# Patient Record
Sex: Female | Born: 1970 | Race: Black or African American | Hispanic: No | Marital: Single | Smoking: Never smoker
Health system: Southern US, Community
[De-identification: ages and names within clinical notes are randomized; demographics above are authoritative.]

## PROBLEM LIST (undated history)

## (undated) DIAGNOSIS — Z8619 Personal history of other infectious and parasitic diseases: Secondary | ICD-10-CM

## (undated) DIAGNOSIS — I209 Angina pectoris, unspecified: Secondary | ICD-10-CM

## (undated) DIAGNOSIS — Z8719 Personal history of other diseases of the digestive system: Secondary | ICD-10-CM

## (undated) DIAGNOSIS — E669 Obesity, unspecified: Secondary | ICD-10-CM

## (undated) DIAGNOSIS — IMO0002 Reserved for concepts with insufficient information to code with codable children: Secondary | ICD-10-CM

## (undated) DIAGNOSIS — I4891 Unspecified atrial fibrillation: Secondary | ICD-10-CM

## (undated) DIAGNOSIS — G43909 Migraine, unspecified, not intractable, without status migrainosus: Secondary | ICD-10-CM

## (undated) DIAGNOSIS — F419 Anxiety disorder, unspecified: Secondary | ICD-10-CM

## (undated) DIAGNOSIS — F32A Depression, unspecified: Secondary | ICD-10-CM

## (undated) DIAGNOSIS — R0600 Dyspnea, unspecified: Secondary | ICD-10-CM

## (undated) DIAGNOSIS — R0609 Other forms of dyspnea: Secondary | ICD-10-CM

## (undated) DIAGNOSIS — M1990 Unspecified osteoarthritis, unspecified site: Secondary | ICD-10-CM

## (undated) DIAGNOSIS — I10 Essential (primary) hypertension: Secondary | ICD-10-CM

## (undated) DIAGNOSIS — Z72821 Inadequate sleep hygiene: Secondary | ICD-10-CM

## (undated) DIAGNOSIS — M5030 Other cervical disc degeneration, unspecified cervical region: Secondary | ICD-10-CM

## (undated) DIAGNOSIS — Z8742 Personal history of other diseases of the female genital tract: Secondary | ICD-10-CM

## (undated) DIAGNOSIS — G4733 Obstructive sleep apnea (adult) (pediatric): Secondary | ICD-10-CM

## (undated) DIAGNOSIS — F319 Bipolar disorder, unspecified: Secondary | ICD-10-CM

## (undated) DIAGNOSIS — M5412 Radiculopathy, cervical region: Secondary | ICD-10-CM

## (undated) DIAGNOSIS — R87619 Unspecified abnormal cytological findings in specimens from cervix uteri: Secondary | ICD-10-CM

## (undated) DIAGNOSIS — D649 Anemia, unspecified: Secondary | ICD-10-CM

## (undated) DIAGNOSIS — N760 Acute vaginitis: Secondary | ICD-10-CM

## (undated) DIAGNOSIS — J45991 Cough variant asthma: Secondary | ICD-10-CM

## (undated) DIAGNOSIS — N938 Other specified abnormal uterine and vaginal bleeding: Secondary | ICD-10-CM

## (undated) DIAGNOSIS — B379 Candidiasis, unspecified: Secondary | ICD-10-CM

## (undated) DIAGNOSIS — N840 Polyp of corpus uteri: Secondary | ICD-10-CM

## (undated) DIAGNOSIS — K219 Gastro-esophageal reflux disease without esophagitis: Secondary | ICD-10-CM

## (undated) DIAGNOSIS — B9689 Other specified bacterial agents as the cause of diseases classified elsewhere: Secondary | ICD-10-CM

## (undated) DIAGNOSIS — D259 Leiomyoma of uterus, unspecified: Secondary | ICD-10-CM

## (undated) HISTORY — DX: Leiomyoma of uterus, unspecified: D25.9

## (undated) HISTORY — DX: Inadequate sleep hygiene: Z72.821

## (undated) HISTORY — DX: Cough variant asthma: J45.991

## (undated) HISTORY — DX: Personal history of other diseases of the digestive system: Z87.19

## (undated) HISTORY — DX: Other forms of dyspnea: R06.09

## (undated) HISTORY — DX: Morbid (severe) obesity due to excess calories: E66.01

## (undated) HISTORY — PX: KNEE SURGERY: SHX244

## (undated) HISTORY — DX: Candidiasis, unspecified: B37.9

## (undated) HISTORY — PX: BACK SURGERY: SHX140

## (undated) HISTORY — DX: Other specified abnormal uterine and vaginal bleeding: N93.8

## (undated) HISTORY — DX: Other specified bacterial agents as the cause of diseases classified elsewhere: B96.89

## (undated) HISTORY — DX: Acute vaginitis: N76.0

## (undated) HISTORY — DX: Polyp of corpus uteri: N84.0

## (undated) HISTORY — DX: Unspecified abnormal cytological findings in specimens from cervix uteri: R87.619

## (undated) HISTORY — DX: Reserved for concepts with insufficient information to code with codable children: IMO0002

## (undated) HISTORY — DX: Personal history of other infectious and parasitic diseases: Z86.19

## (undated) HISTORY — DX: Personal history of other diseases of the female genital tract: Z87.42

## (undated) HISTORY — DX: Gastro-esophageal reflux disease without esophagitis: K21.9

## (undated) HISTORY — DX: Obstructive sleep apnea (adult) (pediatric): G47.33

## (undated) HISTORY — DX: Radiculopathy, cervical region: M54.12

## (undated) HISTORY — PX: CARPAL TUNNEL RELEASE: SHX101

---

## 1993-07-01 DIAGNOSIS — Z8619 Personal history of other infectious and parasitic diseases: Secondary | ICD-10-CM

## 1993-07-01 HISTORY — DX: Personal history of other infectious and parasitic diseases: Z86.19

## 1997-07-01 HISTORY — PX: BREAST REDUCTION SURGERY: SHX8

## 1997-07-01 HISTORY — PX: TUBAL LIGATION: SHX77

## 2008-07-01 HISTORY — PX: TONSILLECTOMY: SUR1361

## 2009-01-11 ENCOUNTER — Ambulatory Visit: Payer: Self-pay | Admitting: Diagnostic Radiology

## 2009-01-11 ENCOUNTER — Emergency Department (HOSPITAL_BASED_OUTPATIENT_CLINIC_OR_DEPARTMENT_OTHER): Admission: EM | Admit: 2009-01-11 | Discharge: 2009-01-11 | Payer: Self-pay | Admitting: Emergency Medicine

## 2009-07-01 HISTORY — PX: HYSTEROSCOPY W/ ENDOMETRIAL ABLATION: SUR665

## 2009-07-01 HISTORY — DX: Morbid (severe) obesity due to excess calories: E66.01

## 2010-02-08 DIAGNOSIS — N938 Other specified abnormal uterine and vaginal bleeding: Secondary | ICD-10-CM

## 2010-02-08 HISTORY — DX: Other specified abnormal uterine and vaginal bleeding: N93.8

## 2010-03-01 DIAGNOSIS — Z8742 Personal history of other diseases of the female genital tract: Secondary | ICD-10-CM

## 2010-03-01 DIAGNOSIS — N840 Polyp of corpus uteri: Secondary | ICD-10-CM

## 2010-03-01 DIAGNOSIS — D259 Leiomyoma of uterus, unspecified: Secondary | ICD-10-CM

## 2010-03-01 HISTORY — DX: Leiomyoma of uterus, unspecified: D25.9

## 2010-03-01 HISTORY — DX: Personal history of other diseases of the female genital tract: Z87.42

## 2010-03-01 HISTORY — DX: Polyp of corpus uteri: N84.0

## 2010-04-24 ENCOUNTER — Ambulatory Visit (HOSPITAL_COMMUNITY): Admission: RE | Admit: 2010-04-24 | Discharge: 2010-04-24 | Payer: Self-pay | Admitting: Obstetrics and Gynecology

## 2010-08-15 ENCOUNTER — Inpatient Hospital Stay (HOSPITAL_COMMUNITY)
Admission: AD | Admit: 2010-08-15 | Discharge: 2010-08-15 | Disposition: A | Discharge status: Home / Self Care | Payer: Medicare Other | Source: Ambulatory Visit | Attending: Obstetrics and Gynecology | Admitting: Obstetrics and Gynecology

## 2010-08-15 DIAGNOSIS — IMO0002 Reserved for concepts with insufficient information to code with codable children: Secondary | ICD-10-CM | POA: Insufficient documentation

## 2010-08-15 LAB — DIFFERENTIAL
Basophils Relative: 1 % (ref 0–1)
Eosinophils Absolute: 0.5 10*3/uL (ref 0.0–0.7)
Eosinophils Relative: 8 % — ABNORMAL HIGH (ref 0–5)
Lymphs Abs: 2.2 10*3/uL (ref 0.7–4.0)
Neutrophils Relative %: 40 % — ABNORMAL LOW (ref 43–77)

## 2010-08-15 LAB — CBC
MCH: 28.5 pg (ref 26.0–34.0)
MCV: 89.9 fL (ref 78.0–100.0)
Platelets: 298 10*3/uL (ref 150–400)
RDW: 14.5 % (ref 11.5–15.5)
WBC: 5.6 10*3/uL (ref 4.0–10.5)

## 2010-08-15 LAB — WET PREP, GENITAL: Yeast Wet Prep HPF POC: NONE SEEN

## 2010-08-24 DIAGNOSIS — B9689 Other specified bacterial agents as the cause of diseases classified elsewhere: Secondary | ICD-10-CM

## 2010-08-24 HISTORY — DX: Other specified bacterial agents as the cause of diseases classified elsewhere: B96.89

## 2010-09-12 LAB — COMPREHENSIVE METABOLIC PANEL
BUN: 13 mg/dL (ref 6–23)
CO2: 24 mEq/L (ref 19–32)
Calcium: 9.1 mg/dL (ref 8.4–10.5)
Creatinine, Ser: 0.97 mg/dL (ref 0.4–1.2)
GFR calc non Af Amer: >60 mL/min (ref >60)
Glucose, Bld: 76 mg/dL (ref 70–99)

## 2010-09-12 LAB — CBC
HCT: 33.4 % — ABNORMAL LOW (ref 36.0–46.0)
Hemoglobin: 11.1 g/dL — ABNORMAL LOW (ref 12.0–15.0)
MCH: 28.7 pg (ref 26.0–34.0)
MCHC: 33.1 g/dL (ref 30.0–36.0)
RDW: 15.9 % — ABNORMAL HIGH (ref 11.5–15.5)

## 2010-09-12 LAB — PREGNANCY, URINE: Preg Test, Ur: NEGATIVE

## 2010-10-08 LAB — DIFFERENTIAL
Basophils Absolute: 0 10*3/uL (ref 0.0–0.1)
Basophils Relative: 1 % (ref 0–1)
Eosinophils Absolute: 0.4 10*3/uL (ref 0.0–0.7)
Monocytes Absolute: 0.9 10*3/uL (ref 0.1–1.0)
Monocytes Relative: 12 % (ref 3–12)
Neutro Abs: 3.7 10*3/uL (ref 1.7–7.7)
Neutrophils Relative %: 50 % (ref 43–77)

## 2010-10-08 LAB — CBC
MCHC: 33.1 g/dL (ref 30.0–36.0)
MCV: 87.8 fL (ref 78.0–100.0)
RDW: 14.1 % (ref 11.5–15.5)

## 2010-10-08 LAB — URINALYSIS, ROUTINE W REFLEX MICROSCOPIC
Glucose, UA: NEGATIVE mg/dL
Hgb urine dipstick: NEGATIVE
Ketones, ur: NEGATIVE mg/dL
Protein, ur: NEGATIVE mg/dL
Urobilinogen, UA: 0.2 mg/dL (ref 0.0–1.0)

## 2010-10-08 LAB — POCT CARDIAC MARKERS: Troponin i, poc: <0.05 ng/mL (ref 0.00–0.09)

## 2010-10-08 LAB — BASIC METABOLIC PANEL
BUN: 16 mg/dL (ref 6–23)
Calcium: 8.7 mg/dL (ref 8.4–10.5)
GFR calc Af Amer: >60 mL/min (ref >60)
Potassium: 4 mEq/L (ref 3.5–5.1)
Sodium: 142 mEq/L (ref 135–145)

## 2010-10-17 DIAGNOSIS — Z8719 Personal history of other diseases of the digestive system: Secondary | ICD-10-CM

## 2010-10-17 HISTORY — DX: Personal history of other diseases of the digestive system: Z87.19

## 2011-07-08 ENCOUNTER — Emergency Department (INDEPENDENT_AMBULATORY_CARE_PROVIDER_SITE_OTHER)
Admission: EM | Admit: 2011-07-08 | Discharge: 2011-07-08 | Disposition: A | Discharge status: Home / Self Care | Payer: Medicare Other | Source: Home / Self Care | Attending: Emergency Medicine | Admitting: Emergency Medicine

## 2011-07-08 DIAGNOSIS — J111 Influenza due to unidentified influenza virus with other respiratory manifestations: Secondary | ICD-10-CM

## 2011-07-08 DIAGNOSIS — J040 Acute laryngitis: Secondary | ICD-10-CM

## 2011-07-08 DIAGNOSIS — J40 Bronchitis, not specified as acute or chronic: Secondary | ICD-10-CM

## 2011-07-08 DIAGNOSIS — J329 Chronic sinusitis, unspecified: Secondary | ICD-10-CM

## 2011-07-08 DIAGNOSIS — R6889 Other general symptoms and signs: Secondary | ICD-10-CM

## 2011-07-08 DIAGNOSIS — H109 Unspecified conjunctivitis: Secondary | ICD-10-CM

## 2011-07-08 HISTORY — DX: Migraine, unspecified, not intractable, without status migrainosus: G43.909

## 2011-07-08 HISTORY — DX: Essential (primary) hypertension: I10

## 2011-07-08 MED ORDER — POLYMYXIN B-TRIMETHOPRIM 10000-0.1 UNIT/ML-% OP SOLN: 1.0000 [drp] | OPHTHALMIC | Status: AC

## 2011-07-08 MED ORDER — GUAIFENESIN-CODEINE 100-10 MG/5ML PO SYRP: 10.0000 mL | ORAL_SOLUTION | Freq: Four times a day (QID) | ORAL | Status: AC | PRN

## 2011-07-08 MED ORDER — PREDNISONE 10 MG PO TABS: ORAL_TABLET | ORAL | Status: DC

## 2011-07-08 MED ORDER — AZITHROMYCIN 250 MG PO TABS: ORAL_TABLET | ORAL | Status: AC

## 2011-07-08 NOTE — ED Provider Notes (Signed)
Chief Complaint  Patient presents with  . URI    History of Present Illness:  Mrs. Barbara Richardson has had a one-week history of headache, fever, sweats, cough productive of green sputum, wheezing, nasal congestion with greenish drainage with blood, sinus pressure, headache, sore throat, hoarseness, and her eyes have been red and she's had greenish drainage from both eyes. She denies any blurry vision. She does have a history of high blood pressure and asthma. She usually controls her asthma with albuterol both by MDI and also by nebulizer. She had taken some of the MDI and nebulizer today.  Review of Systems:  Other than noted above, the patient denies any of the following symptoms. Systemic:  No fever, chills, sweats, fatigue, myalgias, headache, or anorexia. Eye:  No redness, pain or drainage. ENT:  No earache, nasal congestion, rhinorrhea, sinus pressure, or sore throat. Lungs:  No cough, sputum production, wheezing, shortness of breath. Or chest pain. GI:  No nausea, vomiting, abdominal pain or diarrhea. Skin:  No rash or itching.  PMFSH:  Past medical history, family history, social history, meds, and allergies were reviewed.  Physical Exam:   Vital signs:  BP 166/76  Pulse 67  Temp(Src) 99.1 F (37.3 C) (Oral)  Resp 18  SpO2 99%  LMP 07/01/2011 General:  Alert, in no distress. Eye:  Both conjunctiva was were injected and there was some yellowish green drainage.  ENT:  TMs and canals were normal, without erythema or inflammation.  Nasal mucosa was clear and uncongested, without drainage.  Mucous membranes were moist.  Pharynx was clear, without exudate or drainage.  There were no oral ulcerations or lesions. Neck:  Supple, no adenopathy, tenderness or mass. Lungs:  No respiratory distress.  Lungs were clear to auscultation, without wheezes, rales or rhonchi.  Breath sounds were clear and equal bilaterally. Heart:  Regular rhythm, without gallops, murmers or rubs. Skin:  Clear, warm, and  dry, without rash or lesions.  Labs:  None    Radiology:  No results found.  Medications given in [facility]:  None  Assessment:   Diagnoses that have been ruled out:  Diagnoses that are still under consideration:  Final diagnoses:  Influenza-like illness  Conjunctivitis  Sinusitis  Bronchitis  Laryngitis     Plan:   1.  The following meds were prescribed:   New Prescriptions   AZITHROMYCIN (ZITHROMAX Z-PAK) 250 MG TABLET    Take as directed.   GUAIFENESIN-CODEINE (GUIATUSS AC) 100-10 MG/5ML SYRUP    Take 10 mLs by mouth 4 (four) times daily as needed for cough.   PREDNISONE (DELTASONE) 10 MG TABLET    Take 4 tabs daily for 4 days, 3 tabs daily for 4 days, 2 tabs daily for 4 days, then 1 tab daily for 4 days.  Take all tabs at one time with food and preferably in the morning except for the first dose.   TRIMETHOPRIM-POLYMYXIN B (POLYTRIM) OPHTHALMIC SOLUTION    Place 1 drop into both eyes every 4 (four) hours.   2.  The patient was instructed in symptomatic care and handouts were given. 3.  The patient was told to return if becoming worse in any way, if no better in 3 or 4 days, and given some red flag symptoms that would indicate earlier return.   Roque Lias, MD 07/08/11 1754

## 2011-07-08 NOTE — ED Notes (Signed)
C/o productive cough of green sputum, green nasal drainage since 07/02/11.  States yesterday she developed green drainage, redness and soreness to both eyes and today she is hoarse.  States she had sorethroat initially but that resolved.

## 2011-08-19 ENCOUNTER — Encounter (HOSPITAL_COMMUNITY): Payer: Self-pay

## 2011-08-19 ENCOUNTER — Emergency Department (INDEPENDENT_AMBULATORY_CARE_PROVIDER_SITE_OTHER)
Admission: EM | Admit: 2011-08-19 | Discharge: 2011-08-19 | Disposition: A | Discharge status: Home / Self Care | Payer: Medicare Other | Source: Home / Self Care | Attending: Emergency Medicine | Admitting: Emergency Medicine

## 2011-08-19 DIAGNOSIS — L03211 Cellulitis of face: Secondary | ICD-10-CM

## 2011-08-19 DIAGNOSIS — L0201 Cutaneous abscess of face: Secondary | ICD-10-CM

## 2011-08-19 MED ORDER — DOXYCYCLINE HYCLATE 100 MG PO CAPS: 100.0000 mg | ORAL_CAPSULE | Freq: Two times a day (BID) | ORAL | Status: DC

## 2011-08-19 MED ORDER — ACETAMINOPHEN 325 MG PO TABS
ORAL_TABLET | ORAL | Status: AC
  Filled 2011-08-19: qty 3

## 2011-08-19 MED ORDER — IBUPROFEN 800 MG PO TABS
800.0000 mg | ORAL_TABLET | Freq: Once | ORAL | Status: AC
  Administered 2011-08-19: 800 mg via ORAL

## 2011-08-19 MED ORDER — IBUPROFEN 800 MG PO TABS
ORAL_TABLET | ORAL | Status: AC
  Filled 2011-08-19: qty 1

## 2011-08-19 MED ORDER — ACETAMINOPHEN 500 MG PO TABS
1000.0000 mg | ORAL_TABLET | Freq: Once | ORAL | Status: AC
  Administered 2011-08-19: 1000 mg via ORAL

## 2011-08-19 MED ORDER — DOXYCYCLINE HYCLATE 100 MG PO CAPS: 100.0000 mg | ORAL_CAPSULE | Freq: Two times a day (BID) | ORAL | Status: AC

## 2011-08-19 NOTE — Discharge Instructions (Signed)
Abscess An abscess (boil or furuncle) is an infected area under your skin. This area is filled with yellowish white fluid (pus). HOME CARE   Only take medicine as told by your doctor.   Keep the skin clean around your abscess. Keep clothes that may touch the abscess clean.   Change any bandages (dressings) as told by your doctor.   Avoid direct skin contact with other people. The infection can spread by skin contact with others.   Practice good hygiene and do not share personal care items.   Do not share athletic equipment, towels, or whirlpools. Shower after every practice or work out session.   If a draining area cannot be covered:   Do not play sports.   Children should not go to daycare until the wound has healed or until fluid (drainage) stops coming out of the wound.   See your doctor for a follow-up visit as told.  GET HELP RIGHT AWAY IF:   There is more pain, puffiness (swelling), and redness in the wound site.   There is fluid or bleeding from the wound site.   You have muscle aches, chills, fever, or feel sick.   You or your child has a temperature by mouth above 102 F (38.9 C), not controlled by medicine.   Your baby is older than 3 months with a rectal temperature of 102 F (38.9 C) or higher.  MAKE SURE YOU:   Understand these instructions.   Will watch your condition.   Will get help right away if you are not doing well or get worse.  Document Released: 12/04/2007 Document Revised: 02/27/2011 Document Reviewed: 12/04/2007 [facility] Patient Information 2012 [place], [place].

## 2011-08-19 NOTE — ED Notes (Signed)
C/o raised swollen painful area on face since 2-13; minimal relief w OTC medications, cold pack

## 2011-08-19 NOTE — ED Notes (Signed)
Discussed medication compliance, wound care

## 2011-08-19 NOTE — ED Provider Notes (Signed)
History     CSN: [id number]  Arrival date & time 08/19/11  1009   First MD Initiated Contact with Patient 08/19/11 1048      Chief Complaint  Patient presents with  . Skin Ulcer    (Consider location/radiation/quality/duration/timing/severity/associated sxs/prior treatment) HPI Comments: Patient presents to urgent care complaining of a swollen and raised area very tender on the left side of her face (points to left upper lip area obvious abscess) It has been about 4 days and is getting larger and is not draining looks like it has a head"  No fevers, no facial injuries,.  Patient is a 41 y.o. female presenting with abscess. The history is provided by the patient.  Abscess  This is a new problem. The problem occurs continuously. The abscess is present on the face. The problem is moderate. The abscess is characterized by redness and swelling. Pertinent negatives include no anorexia, not drinking less, no fever, no fussiness, no diarrhea, no sore throat and no cough.    Past Medical History  Diagnosis Date  . Hypertension   . Migraines   . Asthma     Past Surgical History  Procedure Date  . Tonsillectomy   . Cesarean section   . Knee surgery     History reviewed. No pertinent family history.  History  Substance Use Topics  . Smoking status: Never Smoker   . Smokeless tobacco: Not on file  . Alcohol Use: No    OB History    Grav Para Term Preterm Abortions TAB SAB Ect Mult Living                  Review of Systems  Constitutional: Negative for fever and fatigue.  HENT: Negative for ear pain and sore throat.   Eyes: Negative for redness.  Respiratory: Negative for cough.   Gastrointestinal: Negative for diarrhea and anorexia.  Skin: Negative for color change and rash.    Allergies  Review of patient's allergies indicates no known allergies.  Home Medications   Current Outpatient Rx  Name Route Sig Dispense Refill  . WELLBUTRIN PO Oral Take by mouth.        . FUROSEMIDE 40 MG PO TABS Oral Take 40 mg by mouth daily.    Marland Kitchen LISINOPRIL 10 MG PO TABS Oral Take 10 mg by mouth daily.    Marland Kitchen LOSARTAN POTASSIUM 100 MG PO TABS Oral Take 100 mg by mouth daily.    Marland Kitchen METOPROLOL SUCCINATE ER 50 MG PO TB24 Oral Take 50 mg by mouth daily. Take with or immediately following a meal.    . OMEPRAZOLE-SODIUM BICARBONATE 20-1100 MG PO CAPS Oral Take 1 capsule by mouth daily before breakfast.    . PRESCRIPTION MEDICATION  States she takes 3 BP medications     . TOPAMAX PO Oral Take by mouth.      . ALBUTEROL IN Inhalation Inhale into the lungs.      Marland Kitchen DOXYCYCLINE HYCLATE 100 MG PO CAPS Oral Take 1 capsule (100 mg total) by mouth 2 (two) times daily. 20 capsule 0  . PREDNISONE 10 MG PO TABS  Take 4 tabs daily for 4 days, 3 tabs daily for 4 days, 2 tabs daily for 4 days, then 1 tab daily for 4 days.  Take all tabs at one time with food and preferably in the morning except for the first dose. 40 tablet 0  . IMITREX PO Oral Take by mouth.        BP  144/80  Pulse 60  Temp(Src) 98.2 F (36.8 C) (Oral)  Resp 18  SpO2 100%  Physical Exam  Nursing note and vitals reviewed. Constitutional: No distress.  HENT:  Head: Normocephalic.    Eyes: Conjunctivae are normal. No scleral icterus.  Neck: Neck supple.  Pulmonary/Chest: Effort normal.  Abdominal: Soft.  Lymphadenopathy:    She has no cervical adenopathy.  Skin: No rash noted. There is erythema.    ED Course  INCISION AND DRAINAGE Date/Time: 08/19/2011 12:29 PM Performed by: Staci Dack Authorized by: Jimmie Molly Consent: Verbal consent obtained. Risks and benefits: risks, benefits and alternatives were discussed Consent given by: patient Patient understanding: patient does not state understanding of the procedure being performed Patient identity confirmed: verbally with patient Type: abscess Location: facial. Local anesthetic: lidocaine 1% without epinephrine Anesthetic total: 3 ml Patient sedated:  no Scalpel size: 15 Incision type: single straight Complexity: complex Drainage: purulent Packing material: 1/2 in iodoform gauze Patient tolerance: Patient tolerated the procedure well with no immediate complications. Comments: And during procedure patient did not feel incision of abscess on direct questioning. Patient post procedure film soreness and require pain medicine both Motrin and Tylenol were provided patient driving on her own.   (including critical care time)   Labs Reviewed  CULTURE, ROUTINE-ABSCESS   No results found.   1. Abscess of face       MDM  Left-sided facial abscess. Peri-oral I+D performed. Abundant purulent material obtained and cultured. For packing removal.        Jimmie Molly, MD 08/19/11 1231

## 2011-08-21 ENCOUNTER — Emergency Department (INDEPENDENT_AMBULATORY_CARE_PROVIDER_SITE_OTHER)
Admission: EM | Admit: 2011-08-21 | Discharge: 2011-08-21 | Disposition: A | Discharge status: Home Health | Payer: Medicare Other | Source: Home / Self Care | Attending: Family Medicine | Admitting: Family Medicine

## 2011-08-21 ENCOUNTER — Encounter (HOSPITAL_COMMUNITY): Payer: Self-pay | Admitting: Emergency Medicine

## 2011-08-21 DIAGNOSIS — L0291 Cutaneous abscess, unspecified: Secondary | ICD-10-CM

## 2011-08-21 LAB — CULTURE, ROUTINE-ABSCESS: Gram Stain: NONE SEEN

## 2011-08-21 NOTE — ED Notes (Signed)
PT HERE WITH PACKING REMOVAL AND WOUND RECHECK S/P I/D WITH PACKING TO LEFT SIDE OF MOUTH BOIL X 2 DYS AGO.PTT DENIES PAIN.NO FEVERS.PACKING INTACT WITH MIN PUS DRAINAGE.PT TAKING PRESCRIBED ATB'S

## 2011-08-21 NOTE — ED Notes (Addendum)
Abscess L face: Mod. MRSA.  Pt. adequately treated with Doxycycline. Pt. Verified x 2 and given result and told she was adequately treated with Doxycycline. Pt. given MRSA instructions and voiced understanding. Barbara Richardson 08/21/2011

## 2011-08-21 NOTE — Discharge Instructions (Signed)
Change bandage daily. Monitor drainage and return if drainage stops or signs of infection appear such as redness, warmth, increased pain.

## 2011-08-21 NOTE — ED Provider Notes (Signed)
History     CSN: [id number]  Arrival date & time 08/21/11  1022   First MD Initiated Contact with Patient 08/21/11 1235      Chief Complaint  Patient presents with  . Wound Check    (Consider location/radiation/quality/duration/timing/severity/associated sxs/prior treatment) HPI Comments: Barbara Richardson presents for evaluation for wound recheck. She was seen here 2 days ago and had an abscess on her face, incised and drained. The wound was packed, and she was told to return for reevaluation. She reports improvement in her pain and swelling of her face. She denies any other complications or complaints. She is taking antibiotics.  Patient is a 41 y.o. female presenting with wound check. The history is provided by the patient.  Wound Check  She was treated in the ED 2 to 3 days ago. Previous treatment in the ED includes I&D of abscess. Treatments since wound repair include oral antibiotics and a wound recheck. There has been colored discharge from the wound. There is no redness present. There is no swelling present. The pain has improved.    Past Medical History  Diagnosis Date  . Hypertension   . Migraines   . Asthma     Past Surgical History  Procedure Date  . Tonsillectomy   . Cesarean section   . Knee surgery     No family history on file.  History  Substance Use Topics  . Smoking status: Never Smoker   . Smokeless tobacco: Not on file  . Alcohol Use: No    OB History    Grav Para Term Preterm Abortions TAB SAB Ect Mult Living                  Review of Systems  Constitutional: Negative.   HENT: Negative.   Eyes: Negative.   Respiratory: Negative.   Cardiovascular: Negative.   Gastrointestinal: Negative.   Genitourinary: Negative.   Musculoskeletal: Negative.   Skin: Positive for wound.  Neurological: Negative.     Allergies  Review of patient's allergies indicates no known allergies.  Home Medications   Current Outpatient Rx  Name Route Sig  Dispense Refill  . ALBUTEROL IN Inhalation Inhale into the lungs.      . WELLBUTRIN PO Oral Take by mouth.      . DOXYCYCLINE HYCLATE 100 MG PO CAPS Oral Take 1 capsule (100 mg total) by mouth 2 (two) times daily. 20 capsule 0  . FUROSEMIDE 40 MG PO TABS Oral Take 40 mg by mouth daily.    Marland Kitchen LISINOPRIL 10 MG PO TABS Oral Take 10 mg by mouth daily.    Marland Kitchen LOSARTAN POTASSIUM 100 MG PO TABS Oral Take 100 mg by mouth daily.    Marland Kitchen METOPROLOL SUCCINATE ER 50 MG PO TB24 Oral Take 50 mg by mouth daily. Take with or immediately following a meal.    . OMEPRAZOLE-SODIUM BICARBONATE 20-1100 MG PO CAPS Oral Take 1 capsule by mouth daily before breakfast.    . PREDNISONE 10 MG PO TABS  Take 4 tabs daily for 4 days, 3 tabs daily for 4 days, 2 tabs daily for 4 days, then 1 tab daily for 4 days.  Take all tabs at one time with food and preferably in the morning except for the first dose. 40 tablet 0  . PRESCRIPTION MEDICATION  States she takes 3 BP medications     . IMITREX PO Oral Take by mouth.      . TOPAMAX PO Oral Take by mouth.  BP 153/80  Pulse 64  Temp(Src) 98.4 F (36.9 C) (Oral)  Resp 16  SpO2 100%  Physical Exam  Nursing note and vitals reviewed. Constitutional: She is oriented to person, place, and time. She appears well-developed and well-nourished.  HENT:  Head: Normocephalic.         1 cm wound on LEFT lower chin, with packing in place; crusting around the packing  Eyes: EOM are normal.  Neck: Normal range of motion.  Pulmonary/Chest: Effort normal.  Musculoskeletal: Normal range of motion.  Neurological: She is alert and oriented to person, place, and time.  Skin: Skin is warm and dry. Lesion noted.       1 cm wound on LEFT lower chin, with packing in place; crusting around the packing  Psychiatric: Her behavior is normal.    ED Course  Procedures (including critical care time)  Labs Reviewed - No data to display No results found.   1. Abscess       MDM  Packing  removed; dressing applied; continue antibiotics        Richardo Priest, MD 08/21/11 1527

## 2011-10-18 ENCOUNTER — Ambulatory Visit (INDEPENDENT_AMBULATORY_CARE_PROVIDER_SITE_OTHER): Payer: Medicaid Other | Admitting: Obstetrics and Gynecology

## 2011-10-18 ENCOUNTER — Encounter: Payer: Self-pay | Admitting: Obstetrics and Gynecology

## 2011-10-18 VITALS — BP 122/82 | Ht 69.0 in | Wt 358.0 lb

## 2011-10-18 DIAGNOSIS — Z01419 Encounter for gynecological examination (general) (routine) without abnormal findings: Secondary | ICD-10-CM

## 2011-10-18 DIAGNOSIS — I10 Essential (primary) hypertension: Secondary | ICD-10-CM

## 2011-10-18 DIAGNOSIS — Z0000 Encounter for general adult medical examination without abnormal findings: Secondary | ICD-10-CM

## 2011-10-18 DIAGNOSIS — J45991 Cough variant asthma: Secondary | ICD-10-CM | POA: Insufficient documentation

## 2011-10-18 DIAGNOSIS — K219 Gastro-esophageal reflux disease without esophagitis: Secondary | ICD-10-CM

## 2011-10-18 DIAGNOSIS — J45909 Unspecified asthma, uncomplicated: Secondary | ICD-10-CM

## 2011-10-18 HISTORY — DX: Cough variant asthma: J45.991

## 2011-10-18 NOTE — Progress Notes (Signed)
The patient reports:normal menses, no abnormal bleeding, pelvic pain or discharge  Contraception:bilateral tubal ligation  Last mammogram:was normal  March 2012 Last pap: was normal April  2012  GC/Chlamydia cultures offered: declined HIV/RPR/HbsAg offered:  declined HSV 1 and 2 glycoprotein offered: declined  Menstrual cycle regular and monthly: Yes Menstrual flow normal: Yes  Urinary symptoms: none Normal bowel movements: Yes Reports abuse at home: No:

## 2011-10-18 NOTE — Progress Notes (Signed)
Subjective:    Barbara Richardson is a 41 y.o. female, G2P2, who presents for an annual exam. S/P endometrial ablation    History   Social History  . Marital Status: Single    Spouse Name: N/A    Number of Children: N/A  . Years of Education: N/A   Social History Main Topics  . Smoking status: Never Smoker   . Smokeless tobacco: Never Used  . Alcohol Use: No  . Drug Use: No  . Sexually Active: No   Other Topics Concern  . None   Social History Narrative  . None    Menstrual cycle:   LMP: Patient's last menstrual period was 09/24/2011.           Cycle: Regular, monthly with normal flow and no severe dysmenorrha  The following portions of the patient's history were reviewed and updated as appropriate: allergies, current medications, past family history, past medical history, past social history, past surgical history and problem list.  Review of Systems Pertinent items are noted in HPI. Breast:Negative for breast lump,nipple discharge or nipple retraction Gastrointestinal: Negative for abdominal pain, change in bowel habits or rectal bleeding Urinary:negative   Objective:    BP 122/82  Ht 5\' 9"  (1.753 m)  Wt 358 lb (162.388 kg)  BMI 52.87 kg/m2  LMP 09/24/2011    Weight:  Wt Readings from Last 1 Encounters:  10/18/11 358 lb (162.388 kg)          BMI: Body mass index is 52.87 kg/(m^2).  General Appearance: Alert, appropriate appearance for age. No acute distress HEENT: Grossly normal Neck / Thyroid: Supple, no masses, nodes or enlargement Lungs: clear to auscultation bilaterally Back: No CVA tenderness Breast Exam: No masses or nodes.No dimpling, nipple retraction or discharge. Cardiovascular: Regular rate and rhythm. S1, S2, no murmur Gastrointestinal: Soft, non-tender, no masses or organomegaly Pelvic Exam: Vulva and vagina appear normal. Bimanual exam reveals normal uterus and adnexa. Rectovaginal: normal rectal, no masses Lymphatic Exam: Non-palpable nodes  in neck, clavicular, axillary, or inguinal regions Skin: no rash or abnormalities Neurologic: Normal gait and speech, no tremor  Psychiatric: Alert and oriented, appropriate affect.   Wet Prep:not applicable Urinalysis:not applicable UPT: Not done   Assessment:    Normal gyn exam  S/P endometrial ablation   Plan:    mammogram pap smear return annually or prn STD screening: declined Contraception:bilateral tubal ligation      Barbara Richardson AMD

## 2011-10-21 LAB — PAP IG W/ RFLX HPV ASCU

## 2012-05-12 ENCOUNTER — Ambulatory Visit: Payer: Medicare Other | Attending: Clinical | Admitting: Audiology

## 2012-05-12 DIAGNOSIS — F802 Mixed receptive-expressive language disorder: Secondary | ICD-10-CM | POA: Insufficient documentation

## 2013-02-10 ENCOUNTER — Ambulatory Visit (INDEPENDENT_AMBULATORY_CARE_PROVIDER_SITE_OTHER): Payer: Worker's Compensation | Admitting: General Surgery

## 2013-02-10 ENCOUNTER — Encounter (INDEPENDENT_AMBULATORY_CARE_PROVIDER_SITE_OTHER): Payer: Self-pay | Admitting: General Surgery

## 2013-02-10 VITALS — BP 140/98 | HR 76 | Resp 16 | Ht 69.0 in | Wt 344.8 lb

## 2013-02-10 DIAGNOSIS — IMO0002 Reserved for concepts with insufficient information to code with codable children: Secondary | ICD-10-CM

## 2013-02-10 NOTE — Progress Notes (Signed)
Subjective:     Patient ID: Marzetta Board, female   DOB: 1971/04/11, 42 y.o.   MRN: [id number]  HPI The patient is a 42 year old female who is referred by Dr. Jola Schmidt for evaluation of the left gluteal spider bite. Patient is approximately 2 weeks ago. She states she was given a course of antibiotics she was complete at this time. The previous drainage has stopped at this time and has never had any fevers while at home.  Review of Systems No fevers chills or emesis while at home.    Objective:   Physical Exam Small superior left gluteal area of induration. There is no synovitis. There is no erythema. There is no drainage.    Assessment:     A 42 year old female status post spider bite with resolved cellulitis     Plan:     1. No need for incision and drainage at this time. 2. Patient follow up as needed

## 2013-04-05 ENCOUNTER — Emergency Department (INDEPENDENT_AMBULATORY_CARE_PROVIDER_SITE_OTHER)
Admission: EM | Admit: 2013-04-05 | Discharge: 2013-04-05 | Disposition: A | Discharge status: Home / Self Care | Payer: Medicare Other | Source: Home / Self Care | Attending: Family Medicine | Admitting: Family Medicine

## 2013-04-05 ENCOUNTER — Encounter (HOSPITAL_COMMUNITY): Payer: Self-pay | Admitting: Emergency Medicine

## 2013-04-05 DIAGNOSIS — J45909 Unspecified asthma, uncomplicated: Secondary | ICD-10-CM

## 2013-04-05 DIAGNOSIS — J40 Bronchitis, not specified as acute or chronic: Secondary | ICD-10-CM

## 2013-04-05 MED ORDER — ALBUTEROL SULFATE (5 MG/ML) 0.5% IN NEBU
INHALATION_SOLUTION | RESPIRATORY_TRACT | Status: AC
  Filled 2013-04-05: qty 1

## 2013-04-05 MED ORDER — IPRATROPIUM BROMIDE 0.02 % IN SOLN
RESPIRATORY_TRACT | Status: AC
  Filled 2013-04-05: qty 2.5

## 2013-04-05 MED ORDER — ACETAMINOPHEN 325 MG PO TABS
650.0000 mg | ORAL_TABLET | Freq: Once | ORAL | Status: AC
  Administered 2013-04-05: 650 mg via ORAL

## 2013-04-05 MED ORDER — IPRATROPIUM BROMIDE 0.02 % IN SOLN
0.5000 mg | Freq: Once | RESPIRATORY_TRACT | Status: AC
  Administered 2013-04-05: 0.5 mg via RESPIRATORY_TRACT

## 2013-04-05 MED ORDER — PREDNISONE 10 MG PO TABS: ORAL_TABLET | ORAL | Status: DC

## 2013-04-05 MED ORDER — PREDNISONE 50 MG PO TABS: 50.0000 mg | ORAL_TABLET | Freq: Every day | ORAL | Status: DC

## 2013-04-05 MED ORDER — GUAIFENESIN-CODEINE 100-10 MG/5ML PO SOLN: 5.0000 mL | Freq: Three times a day (TID) | ORAL | Status: DC | PRN

## 2013-04-05 MED ORDER — AZITHROMYCIN 250 MG PO TABS: 250.0000 mg | ORAL_TABLET | Freq: Every day | ORAL | Status: DC

## 2013-04-05 MED ORDER — ONDANSETRON 4 MG PO TBDP
4.0000 mg | ORAL_TABLET | Freq: Once | ORAL | Status: AC
  Administered 2013-04-05: 4 mg via ORAL

## 2013-04-05 MED ORDER — ACETAMINOPHEN 325 MG PO TABS
ORAL_TABLET | ORAL | Status: AC
  Filled 2013-04-05: qty 2

## 2013-04-05 MED ORDER — ALBUTEROL SULFATE (5 MG/ML) 0.5% IN NEBU
5.0000 mg | INHALATION_SOLUTION | Freq: Once | RESPIRATORY_TRACT | Status: AC
  Administered 2013-04-05: 5 mg via RESPIRATORY_TRACT

## 2013-04-05 MED ORDER — ALBUTEROL SULFATE HFA 108 (90 BASE) MCG/ACT IN AERS: 2.0000 | INHALATION_SPRAY | Freq: Four times a day (QID) | RESPIRATORY_TRACT | Status: AC | PRN

## 2013-04-05 MED ORDER — ONDANSETRON 4 MG PO TBDP
ORAL_TABLET | ORAL | Status: AC
  Filled 2013-04-05: qty 1

## 2013-04-05 NOTE — ED Notes (Signed)
C/o productive cough with green sputum. Chest congestion. Body aches. States felt feverish.  Onset Saturday.   Pt has not tried any otc meds for symptoms.

## 2013-04-05 NOTE — ED Provider Notes (Signed)
Barbara Richardson is a 42 y.o. female who presents to Urgent Care today for cough congestion vomiting fever or and muscle aches. Daughter had similar viral illness earlier this week. No chest pains or trouble breathing. Patient medical history significant for asthma. She's been using albuterol nebulizer which has helped some. The cough is quite bothersome. No palpitations or syncope. She has not tried any over-the-counter medication.   Past Medical History  Diagnosis Date  . Hypertension   . Asthma   . Migraines   . GERD (gastroesophageal reflux disease)   . Yeast infection   . Hx of chlamydia infection 1995  . Abnormal Pap smear   . Hx of constipation 10/17/2010  . BV (bacterial vaginosis) 08/24/2010  . Endometrial polyp 03/2010  . Uterine fibroid 03/2010  . Hx of menorrhagia 03/2010  . DUB (dysfunctional uterine bleeding) 02/08/2010  . Obesity, morbid (more than 100 lbs over ideal weight or BMI > 40) 2011   History  Substance Use Topics  . Smoking status: Never Smoker   . Smokeless tobacco: Former Neurosurgeon  . Alcohol Use: No   ROS as above Medications reviewed. No current facility-administered medications for this encounter.   Current Outpatient Prescriptions  Medication Sig Dispense Refill  . BuPROPion HCl (WELLBUTRIN PO) Take by mouth.        . furosemide (LASIX) 40 MG tablet Take 40 mg by mouth daily.      Marland Kitchen lisinopril (PRINIVIL,ZESTRIL) 10 MG tablet Take 10 mg by mouth daily.      Marland Kitchen losartan (COZAAR) 100 MG tablet Take 100 mg by mouth daily.      . metoprolol succinate (TOPROL-XL) 50 MG 24 hr tablet Take 50 mg by mouth daily. Take with or immediately following a meal.      . Multiple Vitamin (MULTIVITAMIN) capsule Take 1 capsule by mouth daily.      Maxwell Caul Bicarbonate (ZEGERID) 20-1100 MG CAPS Take 1 capsule by mouth daily before breakfast.      . Topiramate (TOPAMAX PO) Take by mouth.        Marland Kitchen albuterol (PROVENTIL HFA;VENTOLIN HFA) 108 (90 BASE) MCG/ACT inhaler  Inhale 2 puffs into the lungs every 6 (six) hours as needed for wheezing.  1 Inhaler  2  . ALBUTEROL IN Inhale into the lungs.        Marland Kitchen azithromycin (ZITHROMAX) 250 MG tablet Take 1 tablet (250 mg total) by mouth daily. Take first 2 tablets together, then 1 every day until finished.  6 tablet  0  . guaiFENesin-codeine 100-10 MG/5ML syrup Take 5 mLs by mouth 3 (three) times daily as needed for cough.  120 mL  0  . Hydrocodone-Acetaminophen 10-500 MG/15ML SOLN Take 500 mg by mouth once.      . predniSONE (DELTASONE) 10 MG tablet Take 4 tabs daily for 4 days, 3 tabs daily for 4 days, 2 tabs daily for 4 days, then 1 tab daily for 4 days.  Take all tabs at one time with food and preferably in the morning except for the first dose.  40 tablet  0  . predniSONE (DELTASONE) 50 MG tablet Take 1 tablet (50 mg total) by mouth daily.  5 tablet  0  . PRESCRIPTION MEDICATION States she takes 3 BP medications       . SUMAtriptan Succinate (IMITREX PO) Take by mouth.          Exam:  BP 138/74  Pulse 64  Temp(Src) 98.4 F (36.9 C) (Oral)  Resp 16  SpO2 100%  LMP 03/29/2013 Gen: Well NAD, obese HEENT: EOMI,  MMM posterior pharynx is mildly erythematous. Tympanic membranes are normal appearing bilaterally. Lungs: Normal work of breathing. Prolonged expiratory phase and wheezing bilaterally. Rhonchorous breath sounds present bilaterally. No crackles. Heart: RRR no MRG Abd: NABS, NT, ND Exts: Non edematous BL  LE, warm and well perfused.    Patient was given 0.5 mg of Atrovent and 5 mg of albuterol which helped subjectively and improved her lung sounds on exam.    Assessment and Plan: 42 y.o. female with bronchitis. Patient likely has viral process however this is complicated by her history of asthma. Plan to treat with prednisone codeine containing cough medications azithromycin and albuterol.  Followup with primary care provider as needed Discussed warning signs or symptoms. Please see discharge  instructions. Patient expresses understanding.      Rodolph Bong, MD 04/05/13 [phone]

## 2014-03-25 ENCOUNTER — Encounter (HOSPITAL_COMMUNITY): Payer: Self-pay | Admitting: Emergency Medicine

## 2014-03-25 ENCOUNTER — Emergency Department (INDEPENDENT_AMBULATORY_CARE_PROVIDER_SITE_OTHER)
Admission: EM | Admit: 2014-03-25 | Discharge: 2014-03-25 | Disposition: A | Discharge status: Home / Self Care | Payer: Medicare Other | Source: Home / Self Care

## 2014-03-25 DIAGNOSIS — E669 Obesity, unspecified: Secondary | ICD-10-CM | POA: Diagnosis not present

## 2014-03-25 DIAGNOSIS — M25569 Pain in unspecified knee: Secondary | ICD-10-CM

## 2014-03-25 DIAGNOSIS — M2351 Chronic instability of knee, right knee: Secondary | ICD-10-CM

## 2014-03-25 DIAGNOSIS — G8929 Other chronic pain: Secondary | ICD-10-CM

## 2014-03-25 DIAGNOSIS — M25561 Pain in right knee: Principal | ICD-10-CM

## 2014-03-25 DIAGNOSIS — M238X9 Other internal derangements of unspecified knee: Secondary | ICD-10-CM

## 2014-03-25 DIAGNOSIS — M1710 Unilateral primary osteoarthritis, unspecified knee: Secondary | ICD-10-CM | POA: Diagnosis not present

## 2014-03-25 DIAGNOSIS — M1711 Unilateral primary osteoarthritis, right knee: Secondary | ICD-10-CM

## 2014-03-25 MED ORDER — HYDROCODONE-ACETAMINOPHEN 7.5-325 MG PO TABS
1.0000 | ORAL_TABLET | ORAL | Status: DC | PRN
Start: 2014-03-25 — End: 2014-05-06

## 2014-03-25 NOTE — ED Notes (Signed)
Problems w her left knee x several years, worse past few days. 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 No new injury.

## 2014-03-25 NOTE — Discharge Instructions (Signed)
Knee Pain °The knee is the complex joint between your thigh and your lower leg. It is made up of bones, tendons, ligaments, and cartilage. The bones that make up the knee are: °· The femur in the thigh. °· The tibia and fibula in the lower leg. °· The patella or kneecap riding in the groove on the lower femur. °CAUSES  °Knee pain is a common complaint with many causes. A few of these causes are: °· Injury, such as: °¨ A ruptured ligament or tendon injury. °¨ Torn cartilage. °· Medical conditions, such as: °¨ Gout °¨ Arthritis °¨ Infections °· Overuse, over training, or overdoing a physical activity. °Knee pain can be minor or severe. Knee pain can accompany debilitating injury. Minor knee problems often respond well to self-care measures or get well on their own. More serious injuries may need medical intervention or even surgery. °SYMPTOMS °The knee is complex. Symptoms of knee problems can vary widely. Some of the problems are: °· Pain with movement and weight bearing. °· Swelling and tenderness. °· Buckling of the knee. °· Inability to straighten or extend your knee. °· Your knee locks and you cannot straighten it. °· Warmth and redness with pain and fever. °· Deformity or dislocation of the kneecap. °DIAGNOSIS  °Determining what is wrong may be very straight forward such as when there is an injury. It can also be challenging because of the complexity of the knee. Tests to make a diagnosis may include: °· Your caregiver taking a history and doing a physical exam. °· Routine X-rays can be used to rule out other problems. X-rays will not reveal a cartilage tear. Some injuries of the knee can be diagnosed by: °¨ Arthroscopy a surgical technique by which a small video camera is inserted through tiny incisions on the sides of the knee. This procedure is used to examine and repair internal knee joint problems. Tiny instruments can be used during arthroscopy to repair the torn knee cartilage (meniscus). °¨ Arthrography  is a radiology technique. A contrast liquid is directly injected into the knee joint. Internal structures of the knee joint then become visible on X-ray film. °¨ An MRI scan is a non X-ray radiology procedure in which magnetic fields and a computer produce two- or three-dimensional images of the inside of the knee. Cartilage tears are often visible using an MRI scanner. MRI scans have largely replaced arthrography in diagnosing cartilage tears of the knee. °· Blood work. °· Examination of the fluid that helps to lubricate the knee joint (synovial fluid). This is done by taking a sample out using a needle and a syringe. °TREATMENT °The treatment of knee problems depends on the cause. Some of these treatments are: °· Depending on the injury, proper casting, splinting, surgery, or physical therapy care will be needed. °· Give yourself adequate recovery time. Do not overuse your joints. If you begin to get sore during workout routines, back off. Slow down or do fewer repetitions. °· For repetitive activities such as cycling or running, maintain your strength and nutrition. °· Alternate muscle groups. For example, if you are a weight lifter, work the upper body on one day and the lower body the next. °· Either tight or weak muscles do not give the proper support for your knee. Tight or weak muscles do not absorb the stress placed on the knee joint. Keep the muscles surrounding the knee strong. °· Take care of mechanical problems. °¨ If you have flat feet, orthotics or special shoes may help.   See your caregiver if you need help. °¨ Arch supports, sometimes with wedges on the inner or outer aspect of the heel, can help. These can shift pressure away from the side of the knee most bothered by osteoarthritis. °¨ A brace called an "unloader" brace also may be used to help ease the pressure on the most arthritic side of the knee. °· If your caregiver has prescribed crutches, braces, wraps or ice, use as directed. The acronym  for this is PRICE. This means protection, rest, ice, compression, and elevation. °· Nonsteroidal anti-inflammatory drugs (NSAIDs), can help relieve pain. But if taken immediately after an injury, they may actually increase swelling. Take NSAIDs with food in your stomach. Stop them if you develop stomach problems. Do not take these if you have a history of ulcers, stomach pain, or bleeding from the bowel. Do not take without your caregiver's approval if you have problems with fluid retention, heart failure, or kidney problems. °· For ongoing knee problems, physical therapy may be helpful. °· Glucosamine and chondroitin are over-the-counter dietary supplements. Both may help relieve the pain of osteoarthritis in the knee. These medicines are different from the usual anti-inflammatory drugs. Glucosamine may decrease the rate of cartilage destruction. °· Injections of a corticosteroid drug into your knee joint may help reduce the symptoms of an arthritis flare-up. They may provide pain relief that lasts a few months. You may have to wait a few months between injections. The injections do have a small increased risk of infection, water retention, and elevated blood sugar levels. °· Hyaluronic acid injected into damaged joints may ease pain and provide lubrication. These injections may work by reducing inflammation. A series of shots may give relief for as long as 6 months. °· Topical painkillers. Applying certain ointments to your skin may help relieve the pain and stiffness of osteoarthritis. Ask your pharmacist for suggestions. Many over the-counter products are approved for temporary relief of arthritis pain. °· In some countries, doctors often prescribe topical NSAIDs for relief of chronic conditions such as arthritis and tendinitis. A review of treatment with NSAID creams found that they worked as well as oral medications but without the serious side effects. °PREVENTION °· Maintain a healthy weight. Extra pounds  put more strain on your joints. °· Get strong, stay limber. Weak muscles are a common cause of knee injuries. Stretching is important. Include flexibility exercises in your workouts. °· Be smart about exercise. If you have osteoarthritis, chronic knee pain or recurring injuries, you may need to change the way you exercise. This does not mean you have to stop being active. If your knees ache after jogging or playing basketball, consider switching to swimming, water aerobics, or other low-impact activities, at least for a few days a week. Sometimes limiting high-impact activities will provide relief. °· Make sure your shoes fit well. Choose footwear that is right for your sport. °· Protect your knees. Use the proper gear for knee-sensitive activities. Use kneepads when playing volleyball or laying carpet. Buckle your seat belt every time you drive. Most shattered kneecaps occur in car accidents. °· Rest when you are tired. °SEEK MEDICAL CARE IF:  °You have knee pain that is continual and does not seem to be getting better.  °SEEK IMMEDIATE MEDICAL CARE IF:  °Your knee joint feels hot to the touch and you have a high fever. °MAKE SURE YOU:  °· Understand these instructions. °· Will watch your condition. °· Will get help right away if you are not   doing well or get worse. Document Released: 04/14/2007 Document Revised: 09/09/2011 Document Reviewed: 04/14/2007 ExitCare Patient Information 2015 [employer]. This information is not intended to replace advice given to you by your health care provider. Make sure you discuss any questions you have with your health care provider. Osteoarthritis Osteoarthritis is a disease that causes soreness and inflammation of a joint. It occurs when the cartilage at the affected joint wears down. Cartilage acts as a cushion, covering the ends of bones where they meet to form a joint. Osteoarthritis is the most common form of arthritis. It often occurs in older people. The joints  affected most often by this condition include those in the:  Ends of the fingers.  Thumbs.  Neck.  Lower back.  Knees.  Hips. CAUSES  Over time, the cartilage that covers the ends of bones begins to wear away. This causes bone to rub on bone, producing pain and stiffness in the affected joints.  RISK FACTORS Certain factors can increase your chances of having osteoarthritis, including:  Older age.  Excessive body weight.  Overuse of joints.  Previous joint injury. SIGNS AND SYMPTOMS   Pain, swelling, and stiffness in the joint.  Over time, the joint may lose its normal shape.  Small deposits of bone (osteophytes) may grow on the edges of the joint.  Bits of bone or cartilage can break off and float inside the joint space. This may cause more pain and damage. DIAGNOSIS  Your health care provider will do a physical exam and ask about your symptoms. Various tests may be ordered, such as:  X-rays of the affected joint.  An MRI scan.  Blood tests to rule out other types of arthritis.  Joint fluid tests. This involves using a needle to draw fluid from the joint and examining the fluid under a microscope. TREATMENT  Goals of treatment are to control pain and improve joint function. Treatment plans may include:  A prescribed exercise program that allows for rest and joint relief.  A weight control plan.  Pain relief techniques, such as:  Properly applied heat and cold.  Electric pulses delivered to nerve endings under the skin (transcutaneous electrical nerve stimulation [TENS]).  Massage.  Certain nutritional supplements.  Medicines to control pain, such as:  Acetaminophen.  Nonsteroidal anti-inflammatory drugs (NSAIDs), such as naproxen.  Narcotic or central-acting agents, such as tramadol.  Corticosteroids. These can be given orally or as an injection.  Surgery to reposition the bones and relieve pain (osteotomy) or to remove loose pieces of bone and  cartilage. Joint replacement may be needed in advanced states of osteoarthritis. HOME CARE INSTRUCTIONS   Take medicines only as directed by your health care provider.  Maintain a healthy weight. Follow your health care provider's instructions for weight control. This may include dietary instructions.  Exercise as directed. Your health care provider can recommend specific types of exercise. These may include:  Strengthening exercises. These are done to strengthen the muscles that support joints affected by arthritis. They can be performed with weights or with exercise bands to add resistance.  Aerobic activities. These are exercises, such as brisk walking or low-impact aerobics, that get your heart pumping.  Range-of-motion activities. These keep your joints limber.  Balance and agility exercises. These help you maintain daily living skills.  Rest your affected joints as directed by your health care provider.  Keep all follow-up visits as directed by your health care provider. SEEK MEDICAL CARE IF:   Your skin turns red.    You develop a rash in addition to your joint pain.  You have worsening joint pain.  You have a fever along with joint or muscle aches. SEEK IMMEDIATE MEDICAL CARE IF:  You have a significant loss of weight or appetite.  You have night sweats. FOR MORE INFORMATION   National Institute of Arthritis and Musculoskeletal and Skin Diseases: www.niams.nih.gov  National Institute on Aging: www.nia.nih.gov  American College of Rheumatology: www.rheumatology.org Document Released: 06/17/2005 Document Revised: 11/01/2013 Document Reviewed: 02/22/2013 ExitCare Patient Information 2015 [employer]. This information is not intended to replace advice given to you by your health care provider. Make sure you discuss any questions you have with your health care provider.  

## 2014-03-25 NOTE — ED Provider Notes (Signed)
CSN: [id number]     Arrival date & time 03/25/14  1254 History   First MD Initiated Contact with Patient 03/25/14 1329     Chief Complaint  Patient presents with  . Knee Pain   (Consider location/radiation/quality/duration/timing/severity/associated sxs/prior Treatment) HPI Comments: Morbidly obese, pleasant female with many yr hx of knee DJD. C/O left knee and ankle pain esp with wt bearing and rotational forces. St feels as if it is going to fall out of joint. No new injury/trauma.  Was seen by her PCP last week and tx with diclofenac, not helping much   Past Medical History  Diagnosis Date  . Hypertension   . Asthma   . Migraines   . GERD (gastroesophageal reflux disease)   . Yeast infection   . Hx of chlamydia infection 1995  . Abnormal Pap smear   . Hx of constipation 10/17/2010  . BV (bacterial vaginosis) 08/24/2010  . Endometrial polyp 03/2010  . Uterine fibroid 03/2010  . Hx of menorrhagia 03/2010  . DUB (dysfunctional uterine bleeding) 02/08/2010  . Obesity, morbid (more than 100 lbs over ideal weight or BMI > 40) 2011   Past Surgical History  Procedure Laterality Date  . Cesarean section    . Knee surgery    . Breast reduction surgery  1999  . Tonsillectomy  2010  . Tubal ligation  1999  . Hysteroscopy w/ endometrial ablation  2011   Family History  Problem Relation Age of Onset  . Diabetes Father    History  Substance Use Topics  . Smoking status: Never Smoker   . Smokeless tobacco: Former Systems developer  . Alcohol Use: No   OB History   Grav Para Term Preterm Abortions TAB SAB Ect Mult Living   2 2             Review of Systems  Constitutional: Positive for activity change. Negative for fever and chills.  HENT: Negative.   Respiratory: Negative for cough and shortness of breath.   Cardiovascular: Negative for chest pain.  Musculoskeletal: Positive for gait problem. Negative for back pain.       As per HPI  Skin: Negative for rash.  Neurological: Negative.      Allergies  Review of patient's allergies indicates no known allergies.  Home Medications   Prior to Admission medications   Medication Sig Start Date End Date Taking? Authorizing Provider  albuterol (PROVENTIL HFA;VENTOLIN HFA) 108 (90 BASE) MCG/ACT inhaler Inhale 2 puffs into the lungs every 6 (six) hours as needed for wheezing. 04/05/13   Gregor Hams, MD  ALBUTEROL IN Inhale into the lungs.      Historical Provider, MD  azithromycin (ZITHROMAX) 250 MG tablet Take 1 tablet (250 mg total) by mouth daily. Take first 2 tablets together, then 1 every day until finished. 04/05/13   Gregor Hams, MD  BuPROPion HCl (WELLBUTRIN PO) Take by mouth.      Historical Provider, MD  furosemide (LASIX) 40 MG tablet Take 40 mg by mouth daily.    Historical Provider, MD  guaiFENesin-codeine 100-10 MG/5ML syrup Take 5 mLs by mouth 3 (three) times daily as needed for cough. 04/05/13   Gregor Hams, MD  HYDROcodone-acetaminophen (NORCO) 7.5-325 MG per tablet Take 1 tablet by mouth every 4 (four) hours as needed. 03/25/14   Janne Napoleon, NP  Hydrocodone-Acetaminophen 10-500 MG/15ML SOLN Take 500 mg by mouth once.    Historical Provider, MD  lisinopril (PRINIVIL,ZESTRIL) 10 MG tablet Take 10 mg by  mouth daily.    Historical Provider, MD  losartan (COZAAR) 100 MG tablet Take 100 mg by mouth daily.    Historical Provider, MD  metoprolol succinate (TOPROL-XL) 50 MG 24 hr tablet Take 50 mg by mouth daily. Take with or immediately following a meal.    Historical Provider, MD  Multiple Vitamin (MULTIVITAMIN) capsule Take 1 capsule by mouth daily.    Historical Provider, MD  Omeprazole-Sodium Bicarbonate (ZEGERID) 20-1100 MG CAPS Take 1 capsule by mouth daily before breakfast.    Historical Provider, MD  predniSONE (DELTASONE) 10 MG tablet Take 4 tabs daily for 4 days, 3 tabs daily for 4 days, 2 tabs daily for 4 days, then 1 tab daily for 4 days.  Take all tabs at one time with food and preferably in the morning except for  the first dose. 04/05/13   Gregor Hams, MD  predniSONE (DELTASONE) 50 MG tablet Take 1 tablet (50 mg total) by mouth daily. 04/05/13   Gregor Hams, MD  PRESCRIPTION MEDICATION States she takes 3 BP medications     Historical Provider, MD  SUMAtriptan Succinate (IMITREX PO) Take by mouth.      Historical Provider, MD  Topiramate (TOPAMAX PO) Take by mouth.      Historical Provider, MD   BP 156/86  Pulse 60  Temp(Src) 98.6 F (37 C) (Oral)  Resp 16  SpO2 97% Physical Exam  Nursing note and vitals reviewed. Constitutional: She is oriented to person, place, and time. She appears well-developed and well-nourished. No distress.  HENT:  Head: Normocephalic and atraumatic.  Eyes: EOM are normal.  Cardiovascular: Normal rate.   Pulmonary/Chest: Effort normal. No respiratory distress.  Musculoskeletal:  Tenderness to the anterior knee primarily along the joint line. No appreciable swelling.  Attempted to test for laxity but due to size, weight and shape of leg is difficult to perform well. Flexion limited to 30 deg. Extrem 180 mg  Neurological: She is alert and oriented to person, place, and time. No cranial nerve deficit.  Skin: Skin is warm and dry.    ED Course  Procedures (including critical care time) Labs Review Labs Reviewed - No data to display  Imaging Review No results found.   MDM   1. Chronic knee pain, right   2. Primary osteoarthritis of right knee   3. Recurrent knee instability, right   4. Obesity    Norco 7.5 q 4h prn #15 Diclofenac per PCP, Stop the Naprosyn Ice Immobilization as possible. Large knee/thigh circumference. See PCP next week, need ortho eval Knee flex and ext movements to maintain quadricep tone.     Janne Napoleon, NP 03/25/14 1438

## 2014-03-26 NOTE — ED Provider Notes (Signed)
Medical screening examination/treatment/procedure(s) were performed by resident physician or non-physician practitioner and as supervising physician I was immediately available for consultation/collaboration.   Pauline Good MD.   Billy Fischer, MD 03/26/14 [phone]

## 2014-05-02 ENCOUNTER — Encounter (HOSPITAL_COMMUNITY): Payer: Self-pay | Admitting: Emergency Medicine

## 2014-05-06 ENCOUNTER — Encounter: Payer: Self-pay | Admitting: Internal Medicine

## 2014-05-06 ENCOUNTER — Other Ambulatory Visit (INDEPENDENT_AMBULATORY_CARE_PROVIDER_SITE_OTHER): Payer: Medicare Other

## 2014-05-06 ENCOUNTER — Ambulatory Visit (INDEPENDENT_AMBULATORY_CARE_PROVIDER_SITE_OTHER): Payer: Medicare Other | Admitting: Internal Medicine

## 2014-05-06 ENCOUNTER — Ambulatory Visit (INDEPENDENT_AMBULATORY_CARE_PROVIDER_SITE_OTHER)
Admission: RE | Admit: 2014-05-06 | Discharge: 2014-05-06 | Disposition: A | Discharge status: Home / Self Care | Payer: Medicare Other | Source: Ambulatory Visit | Attending: Internal Medicine | Admitting: Internal Medicine

## 2014-05-06 ENCOUNTER — Encounter (INDEPENDENT_AMBULATORY_CARE_PROVIDER_SITE_OTHER): Payer: Self-pay

## 2014-05-06 VITALS — BP 122/84 | HR 66 | Ht 69.0 in | Wt 376.4 lb

## 2014-05-06 DIAGNOSIS — R0600 Dyspnea, unspecified: Secondary | ICD-10-CM

## 2014-05-06 DIAGNOSIS — R0609 Other forms of dyspnea: Secondary | ICD-10-CM

## 2014-05-06 HISTORY — DX: Dyspnea, unspecified: R06.00

## 2014-05-06 HISTORY — DX: Other forms of dyspnea: R06.09

## 2014-05-06 LAB — CBC WITH DIFFERENTIAL/PLATELET
BASOS ABS: 0.1 10*3/uL (ref 0.0–0.1)
Basophils Relative: 1.2 % (ref 0.0–3.0)
EOS ABS: 0.7 10*3/uL (ref 0.0–0.7)
Eosinophils Relative: 9.3 % — ABNORMAL HIGH (ref 0.0–5.0)
HCT: 39.5 % (ref 36.0–46.0)
HEMOGLOBIN: 13.2 g/dL (ref 12.0–15.0)
LYMPHS PCT: 34.8 % (ref 12.0–46.0)
Lymphs Abs: 2.7 10*3/uL (ref 0.7–4.0)
MCHC: 33.3 g/dL (ref 30.0–36.0)
MCV: 90.2 fl (ref 78.0–100.0)
Monocytes Absolute: 0.7 10*3/uL (ref 0.1–1.0)
Monocytes Relative: 9.3 % (ref 3.0–12.0)
NEUTROS ABS: 3.5 10*3/uL (ref 1.4–7.7)
NEUTROS PCT: 45.4 % (ref 43.0–77.0)
PLATELETS: 283 10*3/uL (ref 150.0–400.0)
RBC: 4.38 Mil/uL (ref 3.87–5.11)
RDW: 14.1 % (ref 11.5–15.5)
WBC: 7.7 10*3/uL (ref 4.0–10.5)

## 2014-05-06 LAB — BASIC METABOLIC PANEL
BUN: 10 mg/dL (ref 6–23)
CALCIUM: 8.9 mg/dL (ref 8.4–10.5)
CO2: 21 mEq/L (ref 19–32)
CREATININE: 1 mg/dL (ref 0.4–1.2)
Chloride: 108 mEq/L (ref 96–112)
GFR: 76.29 mL/min (ref >60.00)
GLUCOSE: 84 mg/dL (ref 70–99)
Potassium: 3.7 mEq/L (ref 3.5–5.1)
Sodium: 138 mEq/L (ref 135–145)

## 2014-05-06 LAB — BRAIN NATRIURETIC PEPTIDE: PRO B NATRI PEPTIDE: 20 pg/mL (ref 0.0–100.0)

## 2014-05-06 NOTE — Patient Instructions (Addendum)
symbicort 160 Take 2 puffs first thing in am and then another 2 puffs about 12 hours later.   Only use your albuterol  as a rescue medication to be used if you can't catch your breath by resting or doing a relaxed purse lip breathing pattern.  - The less you use it, the better it will work when you need it. - Ok to use up to 2 puffs  every 4 hours if you must but call for immediate appointment if use goes up over your usual need - Don't leave home without it !!  (think of it like the spare tire for your car)   No benzopril or lisinopril  Take your zegerd before bfast and supper for now   See Tammy NP w/in 2 weeks with all your medications, even over the counter meds, separated in two separate bags, the ones you take no matter what vs the ones you stop once you feel better and take only as needed when you feel you need them.   Tammy  will generate for you a new user friendly medication calendar that will put [place] all on the same page re: your medication use.     Without this process, it simply isn't possible to assure that we are providing  your outpatient care  with  the attention to detail we feel you deserve.   If we cannot assure that you're getting that kind of care,  then we cannot manage your problem effectively from this clinic.  Once you have seen Tammy and we are sure that we're all on the same page with your medication use she will arrange follow up with me with pfts  Please remember to go to the lab and x-ray department downstairs for your tests - we will call you with the results when they are available.

## 2014-05-06 NOTE — Progress Notes (Signed)
Quick Note:  LMTCB ______ 

## 2014-05-06 NOTE — Progress Notes (Signed)
Subjective:    Patient ID: Barbara Richardson, female    DOB: 04-06-1971,  MRN: [id number]  HPI  89  yobf with dx of asthma as child with lots of "bronchitis but outgrew by age 43, no trouble with 1st pregnancy  Then around 1999  At wt 235 started noted breathing problems after 2nd pregancy around 2000  p 95 lbs  Breathing started getting getting worse  and started needing albuterol > allergy eval in [facility] does not remember results Pos but no shots apparently rec referred to pulmonary clinic 05/06/2014 by Jackson Latino with worseing cough and sob refractory to symbicort since late 2014   05/06/2014 1st [facility] office visit/ Payson Evrard  ? Recently on ACEi ? Has really stopped?   Chief Complaint  Patient presents with  . Pulmonary Consult    Referred by Dr. Lin Landsman. Pt c/o cough and SOB  since "always"- worse for the past year.  She states that she is SOB "all the time"- with or without any exertion. Her cough is prod with minimal green sputum. She has a rescue inhaler but does not know how often she uses this, instread uses symbicort 3-4 times per day  went from bad to worse x one year daily > noct symptoms of cough and sob at rest and thoroughly confused with maint vs prns  No obvious day to day or daytime variabilty or assoc   cp or chest tightness, subjective wheeze overt sinus or hb symptoms. No unusual exp hx or h/o childhood pna/ asthma or knowledge of premature birth.    Also denies any obvious fluctuation of symptoms with weather or environmental changes or other aggravating or alleviating factors except as outlined above   Current Medications, Allergies, Complete Past Medical History, Past Surgical History, Family History, and Social History were reviewed in [employer] record.  ROS  The following are not active complaints unless bolded sore throat, dysphagia, dental problems, itching, sneezing,  nasal congestion or excess/ purulent secretions, ear  ache,   fever, chills, sweats, unintended wt loss, pleuritic or exertional cp, hemoptysis,  orthopnea pnd or leg swelling, presyncope, palpitations, heartburn, abdominal pain, anorexia, nausea, vomiting, diarrhea  or change in bowel or urinary habits, change in stools or urine, dysuria,hematuria,  rash, arthralgias, visual complaints, headache, numbness weakness or ataxia or problems with walking or coordination,  change in mood/affect or memory.       Review of Systems     Objective:   Physical Exam  amb massively obese bf nad with classic pseudowheeze   Wt Readings from Last 3 Encounters:  05/06/14 376 lb 6.4 oz (170.734 kg)  02/10/13 344 lb 12.8 oz (156.4 kg)  10/18/11 358 lb (162.388 kg)    Vital signs reviewed  HEENT: nl dentition, turbinates, and orophanx. Nl external ear canals without cough reflex   NECK :  without JVD/Nodes/TM/ nl carotid upstrokes bilaterally   LUNGS: no acc muscle use, clear to A and P bilaterally without cough on insp or exp maneuvers   CV:  RRR  no s3 or murmur or increase in P2,  1+ pitting L > R  edema   ABD:  soft and nontender with nl excursion in the supine position. No bruits or organomegaly, bowel sounds nl  MS:  warm without deformities, calf tenderness, cyanosis or clubbing  SKIN: warm and dry without lesions    NEURO:  alert, approp, no deficits    CXR  05/06/2014 :  Lungs are  clear. Heart size and pulmonary vascularity are normal. No adenopathy. No bone lesions. There is slight upper thoracic Levoscoliosis.    Recent Labs Lab 05/06/14 1043  NA 138  K 3.7  CL 108  CO2 21  BUN 10  CREATININE 1.0  GLUCOSE 84    Recent Labs Lab 05/06/14 1043  HGB 13.2  HCT 39.5  WBC 7.7  PLT 283.0       Lab Results  Component Value Date   PROBNP 20.0 05/06/2014           Assessment & Plan:

## 2014-05-06 NOTE — Assessment & Plan Note (Addendum)
Symptoms are markedly disproportionate to objective findings and not clear this is a lung problem but pt does appear to have difficult airway management issues. DDX of  difficult airways management all start with A and  include Adherence, Ace Inhibitors, Acid Reflux, Active Sinus Disease, Alpha 1 Antitripsin deficiency, Anxiety masquerading as Airways dz,  ABPA,  allergy(esp in young), Aspiration (esp in elderly), Adverse effects of DPI,  Active smokers, plus two Bs  = Bronchiectasis and Beta blocker use..and one C= CHF  Adherence is always the initial "prime suspect" and is a multilayered concern that requires a "trust but verify" approach in every patient - starting with knowing how to use medications, especially inhalers, correctly, keeping up with refills and understanding the fundamental difference between maintenance and prns vs those medications only taken for a very short course and then stopped and not refilled.  - thoroughly confused with meds - The proper method of use, as well as anticipated side effects, of a metered-dose inhaler are discussed and demonstrated to the patient. Improved effectiveness after extensive coaching during this visit to a level of approximately  75% from a baseline of 0 so continue symbicort 160 2 bid for now  ? ACEi effect > make sure she's permanently off   ? Acid (or non-acid) GERD > always difficult to exclude as up to 75% of pts in some series report no assoc GI/ Heartburn symptoms> rec max (24h)  acid suppression and diet restrictions/ reviewed and instructions given in writing.   Needs re check in 2 weeks for med reconciliation before going further.  To keep things simple, I have asked the patient to first separate medicines that are perceived as maintenance, that is to be taken daily "no matter what", from those medicines that are taken on only on an as-needed basis and I have given the patient examples of both, and then return to see our NP to generate a   detailed  medication calendar which should be followed until the next physician sees the patient and updates it.

## 2014-05-07 LAB — D-DIMER, QUANTITATIVE (NOT AT ARMC): D DIMER QUANT: 0.62 ug{FEU}/mL — AB (ref 0.00–0.48)

## 2014-05-09 NOTE — Progress Notes (Signed)
Quick Note:  LMTCB ______ 

## 2014-05-10 NOTE — Progress Notes (Signed)
Quick Note:  LMTCB ______ 

## 2014-05-11 ENCOUNTER — Encounter: Payer: Self-pay | Admitting: *Deleted

## 2014-05-11 NOTE — Progress Notes (Signed)
Quick Note:    Letter mailed.  ______

## 2014-05-11 NOTE — Progress Notes (Signed)
Quick Note:  Letter mailed to the pt ______

## 2014-05-16 ENCOUNTER — Telehealth: Payer: Self-pay | Admitting: Internal Medicine

## 2014-05-16 NOTE — Telephone Encounter (Signed)
Pt called. Informed pt of the lab and CXR results from 05/06/14. Pt verbalized understanding. Pt also stated her heart rate has increase since MW stopped pt's lisinopril. Offered pt to send message to MW for any recs pt stated she will wait to address the issue at the f/u with TP on 11/20. Pt aware to seek emergency care if s/s worsen.

## 2014-05-20 ENCOUNTER — Encounter: Payer: Self-pay | Admitting: Adult Health

## 2014-05-20 ENCOUNTER — Ambulatory Visit (INDEPENDENT_AMBULATORY_CARE_PROVIDER_SITE_OTHER): Payer: Medicare Other | Admitting: Adult Health

## 2014-05-20 VITALS — BP 128/78 | HR 78 | Temp 97.0°F | Ht 69.0 in | Wt 375.4 lb

## 2014-05-20 DIAGNOSIS — G4719 Other hypersomnia: Secondary | ICD-10-CM

## 2014-05-20 DIAGNOSIS — J4531 Mild persistent asthma with (acute) exacerbation: Secondary | ICD-10-CM

## 2014-05-20 DIAGNOSIS — I10 Essential (primary) hypertension: Secondary | ICD-10-CM

## 2014-05-20 MED ORDER — OMEPRAZOLE-SODIUM BICARBONATE 20-1100 MG PO CAPS: 1.0000 | ORAL_CAPSULE | Freq: Two times a day (BID) | ORAL | Status: DC

## 2014-05-20 MED ORDER — BUDESONIDE-FORMOTEROL FUMARATE 160-4.5 MCG/ACT IN AERO: 2.0000 | INHALATION_SPRAY | Freq: Two times a day (BID) | RESPIRATORY_TRACT | Status: DC

## 2014-05-20 NOTE — Patient Instructions (Addendum)
-  Remain off of Lisinopril .  -Continue on symbicort 160 Take 2 puffs first thing in am and then another 2 puffs about 12 hours later.  -Only use your albuterol  as a rescue medication to be used if you can't catch your breath by resting or doing a relaxed purse lip breathing pattern.  - The less you use it, the better it will work when you need it. - Ok to use up to 2 puffs  every 4 hours if you must but call for immediate appointment if use goes up over your usual need - Don't leave home without it !!  (think of it like the spare tire for your car)  -Continue on Zegerd before bfast and supper. -Follow up Dr. Melvyn Novas  In 2 months for PFT  Follow up with family doctor for your blood pressure Avoid ACE inhibitors in future  We will set you up for sleep consult with one of our sleep specialist .

## 2014-05-20 NOTE — Progress Notes (Signed)
Subjective:    Patient ID: Barbara Richardson, female    DOB: July 01, 1971,  MRN: [id number]  HPI  10  yobf with dx of asthma as child with lots of "bronchitis but outgrew by age 43, no trouble with 1st pregnancy  Then around 1999  At wt 235 started noted breathing problems after 2nd pregancy around 2000  p 95 lbs  Breathing started getting getting worse  and started needing albuterol > allergy eval in [facility] does not remember results Pos but no shots apparently rec referred to pulmonary clinic 05/06/2014 by Jackson Latino with worseing cough and sob refractory to symbicort since late 2014   05/06/2014 1st [facility] office visit/ Wert  ? Recently on ACEi ? Has really stopped?   Chief Complaint  Patient presents with  . Pulmonary Consult    Referred by Dr. Lin Landsman. Pt c/o cough and SOB  since "always"- worse for the past year.  She states that she is SOB "all the time"- with or without any exertion. Her cough is prod with minimal green sputum. She has a rescue inhaler but does not know how often she uses this, instread uses symbicort 3-4 times per day  went from bad to worse x one year daily > noct symptoms of cough and sob at rest and thoroughly confused with maint vs prns >>d/c ACE and symbicort rx   05/20/2014 follow-up and medication review Patient returns for a two-week follow-up She was seen last visit for primary consult for asthma with shortness of breath and cough She was recommended to stop her ACE inhibitor Begin Symbicort twice daily We reviewed all her medications organize them into a medication count with patient education Since last visit. Does feel that she has improved Her cough is nearly resolved She denies any chest pain, orthopnea, PND or leg swelling   Current Medications, Allergies, Complete Past Medical History, Past Surgical History, Family History, and Social History were reviewed in [employer] record.  ROS  The following are not  active complaints unless bolded sore throat, dysphagia, dental problems, itching, sneezing,  nasal congestion or excess/ purulent secretions, ear ache,   fever, chills, sweats, unintended wt loss, pleuritic or exertional cp, hemoptysis,  orthopnea pnd or leg swelling, presyncope, palpitations, heartburn, abdominal pain, anorexia, nausea, vomiting, diarrhea  or change in bowel or urinary habits, change in stools or urine, dysuria,hematuria,  rash, arthralgias, visual complaints, headache, numbness weakness or ataxia or problems with walking or coordination,  change in mood/affect or memory.            Objective:   Physical Exam  amb massively obese bf nad  HEENT: nl dentition, turbinates, and orophanx. Nl external ear canals without cough reflex   NECK :  without JVD/Nodes/TM/ nl carotid upstrokes bilaterally   LUNGS: no acc muscle use, clear to A and P bilaterally without cough on insp or exp maneuvers   CV:  RRR  no s3 or murmur or increase in P2,  1+ pitting L > R  edema   ABD:  soft and nontender with nl excursion in the supine position. No bruits or organomegaly, bowel sounds nl  MS:  warm without deformities, calf tenderness, cyanosis or clubbing  SKIN: warm and dry without lesions    NEURO:  alert, approp, no deficits    CXR  05/06/2014 :  Lungs are clear. Heart size and pulmonary vascularity are normal. No adenopathy. No bone lesions. There is slight upper thoracic Levoscoliosis.  Recent Labs Lab 05/06/14 1043  NA 138  K 3.7  CL 108  CO2 21  BUN 10  CREATININE 1.0  GLUCOSE 84    Recent Labs Lab 05/06/14 1043  HGB 13.2  HCT 39.5  WBC 7.7  PLT 283.0       Lab Results  Component Value Date   PROBNP 20.0 05/06/2014           Assessment & Plan:

## 2014-05-23 DIAGNOSIS — G4719 Other hypersomnia: Secondary | ICD-10-CM | POA: Insufficient documentation

## 2014-05-23 NOTE — Assessment & Plan Note (Signed)
Would avoid ACE inhibitors  Plan  Remain off of Lisinopril .  Follow up with family doctor for your blood pressure

## 2014-05-23 NOTE — Assessment & Plan Note (Signed)
?   Flare /UACS  Remain off ACE  Plan  Remain off of Lisinopril .  -Continue on symbicort 160 Take 2 puffs first thing in am and then another 2 puffs about 12 hours later.  -Only use your albuterol  as a rescue medication to be used if you can't catch your breath by resting or doing a relaxed purse lip breathing pattern.  -Continue on Zegerd before bfast and supper. -Follow up Dr. Melvyn Novas  In 2 months for PFT  Avoid ACE inhibitors in future

## 2014-05-23 NOTE — Assessment & Plan Note (Signed)
Reports hx of sleep disorder in past ,  Had recent sleep study in last couple of years, can not remember where- will get info.  Says she had CPAP but turned it back in  Request sleep referral .

## 2014-07-20 ENCOUNTER — Ambulatory Visit (INDEPENDENT_AMBULATORY_CARE_PROVIDER_SITE_OTHER): Payer: Medicare Other | Admitting: Internal Medicine

## 2014-07-20 ENCOUNTER — Encounter: Payer: Self-pay | Admitting: Internal Medicine

## 2014-07-20 ENCOUNTER — Other Ambulatory Visit: Payer: Self-pay | Admitting: Internal Medicine

## 2014-07-20 VITALS — BP 120/82 | HR 61 | Temp 98.4°F | Ht 67.0 in | Wt 379.0 lb

## 2014-07-20 DIAGNOSIS — I10 Essential (primary) hypertension: Secondary | ICD-10-CM

## 2014-07-20 DIAGNOSIS — R0600 Dyspnea, unspecified: Secondary | ICD-10-CM

## 2014-07-20 DIAGNOSIS — G4719 Other hypersomnia: Secondary | ICD-10-CM

## 2014-07-20 LAB — PULMONARY FUNCTION TEST
DL/VA % pred: 149 %
DL/VA: 7.7 ml/min/mmHg/L
DLCO UNC % PRED: 120 %
DLCO UNC: 34.05 ml/min/mmHg
FEF 25-75 POST: 3.19 L/s
FEF 25-75 Pre: 2.78 L/sec
FEF2575-%Change-Post: 14 %
FEF2575-%PRED-POST: 111 %
FEF2575-%PRED-PRE: 97 %
FEV1-%Change-Post: 5 %
FEV1-%PRED-POST: 103 %
FEV1-%PRED-PRE: 98 %
FEV1-POST: 2.79 L
FEV1-PRE: 2.66 L
FEV1FVC-%CHANGE-POST: 0 %
FEV1FVC-%PRED-PRE: 99 %
FEV6-%CHANGE-POST: 5 %
FEV6-%Pred-Post: 105 %
FEV6-%Pred-Pre: 99 %
FEV6-Post: 3.44 L
FEV6-Pre: 3.25 L
FEV6FVC-%Change-Post: 0 %
FEV6FVC-%PRED-POST: 102 %
FEV6FVC-%Pred-Pre: 101 %
FVC-%CHANGE-POST: 5 %
FVC-%PRED-POST: 102 %
FVC-%Pred-Pre: 97 %
FVC-Post: 3.44 L
FVC-Pre: 3.26 L
POST FEV1/FVC RATIO: 81 %
PRE FEV1/FVC RATIO: 82 %
PRE FEV6/FVC RATIO: 100 %
Post FEV6/FVC ratio: 100 %
RV % pred: 83 %
RV: 1.53 L
TLC % pred: 80 %
TLC: 4.44 L

## 2014-07-20 NOTE — Patient Instructions (Addendum)
Ok to leave off symbicort but if breathing or cough get worse for any reason start it back up   Weight control is simply a matter of calorie balance which needs to be tilted in your favor by eating less and exercising more.  To get the most out of exercise, you need to be continuously aware that you are short of breath, but never out of breath, for 30 minutes daily. As you improve, it will actually be easier for you to do the same amount of exercise  in  30 minutes so always push to the level where you are short of breath.  If this does not result in gradual weight reduction then I strongly recommend you see a nutritionist with a food diary x 2 weeks so that we can work out a negative calorie balance which is universally effective in steady weight loss programs.  Think of your calorie balance like you do your bank account where in this case you want the balance to go down so you must take in less calories than you burn up.  It's just that simple:  Hard to do, but easy to understand.  Good luck!   Schedule eval by next available sleep doctor Late add needs tsh on return if not done yet

## 2014-07-20 NOTE — Progress Notes (Signed)
PFT done today. 

## 2014-07-20 NOTE — Progress Notes (Signed)
Subjective:    Patient ID: Barbara Richardson, female    DOB: 11-03-1970,  MRN: [id number]     Brief patient profile:  12  yobf with dx of asthma as child with lots of "bronchitis but outgrew by age 44, no trouble with 1st pregnancy  Then around 1999  At wt 235 started noted breathing problems after 2nd pregancy around 2000  p 95 lbs  Breathing started getting getting worse  and started needing albuterol > allergy eval in [facility] does not remember results Pos but no shots apparently rec referred to pulmonary clinic 05/06/2014 by Barbara Richardson with worseing cough and sob refractory to symbicort since late 2014 with pfts nl 12 h p last symbicort  on 07/20/2014    History of Present Illness  05/06/2014 1st [facility] office visit/ Barbara Richardson  ? Recently on ACEi ? Has really stopped?   Chief Complaint  Patient presents with  . Pulmonary Consult    Referred by Barbara Richardson. Pt c/o cough and SOB  since "always"- worse for the past year.  She states that she is SOB "all the time"- with or without any exertion. Her cough is prod with minimal green sputum. She has a rescue inhaler but does not know how often she uses this, instread uses symbicort 3-4 times per day  went from bad to worse x one year daily > noct symptoms of cough and sob at rest and thoroughly confused with maint vs prns >>d/c ACEi  and symbicort rx   05/20/2014 follow-up and medication review Patient returns for a two-week follow-up She was seen last visit for primary consult for asthma with shortness of breath and cough She was recommended to stop her ACE inhibitor Begin Symbicort twice daily We reviewed all her medications organize them into a medication count with patient education Since last visit. Does feel that she has improved Her cough is nearly resolved rec -Remain off of Lisinopril .  -Continue on symbicort 160 Take 2 puffs first thing in am and then another 2 puffs about 12 hours later.  -Only use your albuterol  as  a rescue medication to be used if you can't catch your breath by resting or doing a relaxed purse lip breathing pattern.  - The less you use it, the better it will work when you need it. - Ok to use up to 2 puffs  every 4 hours if you must but call for immediate appointment if use goes up over your usual need - Don't leave home without it !!  (think of it like the spare tire for your car)  -Continue on Zegerd before bfast and supper. -Follow up Barbara Richardson  In 2 months for PFT  Follow up with family doctor for your blood pressure Avoid ACE inhibitors in future       07/20/2014 f/u ov/Barbara Richardson re: ? Asthma vs pseudoasthma from ACEi, > much better since stopped  Chief Complaint  Patient presents with  . Follow-up    with PFT. Pt states cough and SOB is better since last OV.     Not limited by breathing from desired activities  But relatively sendentary, not using saba at all, did not use symbicort am of ov, no longer consistent with ppi  No obvious day to day or daytime variabilty or assoc chronic cough or cp or chest tightness, subjective wheeze overt sinus or hb symptoms. No unusual exp hx or h/o childhood pna/ asthma or knowledge of premature birth.  Sleeping  ok without nocturnal  or early am exacerbation  of respiratory  c/o's or need for noct saba. Also denies any obvious fluctuation of symptoms with weather or environmental changes or other aggravating or alleviating factors except as outlined above   Current Medications, Allergies, Complete Past Medical History, Past Surgical History, Family History, and Social History were reviewed in [employer] record.  ROS  The following are not active complaints unless bolded sore throat, dysphagia, dental problems, itching, sneezing,  nasal congestion or excess/ purulent secretions, ear ache,   fever, chills, sweats, unintended wt loss, pleuritic or exertional cp, hemoptysis,  orthopnea pnd or leg swelling, presyncope,  palpitations, heartburn, abdominal pain, anorexia, nausea, vomiting, diarrhea  or change in bowel or urinary habits, change in stools or urine, dysuria,hematuria,  rash, arthralgias, visual complaints, headache, numbness weakness or ataxia or problems with walking or coordination,  change in mood/affect or memory.               Objective:   Physical Exam  amb massively obese bf nad  Wt Readings from Last 3 Encounters:  07/20/14 379 lb (171.913 kg)  05/20/14 375 lb 6.4 oz (170.28 kg)  05/06/14 376 lb 6.4 oz (170.734 kg)    Vital signs reviewed   HEENT: nl dentition, turbinates, and orophanx. Nl external ear canals without cough reflex   NECK :  without JVD/Nodes/TM/ nl carotid upstrokes bilaterally   LUNGS: no acc muscle use, clear to A and P bilaterally without cough on insp or exp maneuvers   CV:  RRR  no s3 or murmur or increase in P2,  Trace  pitting L > R  edema   ABD:  soft and nontender with nl excursion in the supine position. No bruits or organomegaly, bowel sounds nl  MS:  warm without deformities, calf tenderness, cyanosis or clubbing  SKIN: warm and dry without lesions    NEURO:  alert, approp, no deficits    CXR  05/06/2014 :  Lungs are clear. Heart size and pulmonary vascularity are normal. No adenopathy. No bone lesions. There is slight upper thoracic Levoscoliosis.    Recent Labs Lab 05/06/14 1043  NA 138  K 3.7  CL 108  CO2 21  BUN 10  CREATININE 1.0  GLUCOSE 84    Recent Labs Lab 05/06/14 1043  HGB 13.2  HCT 39.5  WBC 7.7  PLT 283.0       Lab Results  Component Value Date   PROBNP 20.0 05/06/2014           Assessment & Plan:

## 2014-07-23 ENCOUNTER — Encounter: Payer: Self-pay | Admitting: Internal Medicine

## 2014-07-23 NOTE — Assessment & Plan Note (Addendum)
-   trial off all acei 05/06/2014  > improved 07/20/14 with nl pfts x low erv >  This is likely obesity and not clear she really has any asthma so rec trial off  I had an extended summary discussion with the patient reviewing all relevant studies completed to date and  lasting 15 to 20 minutes of a 25 minute visit on the following ongoing concerns:   1) main issue from our perspective is obesity/ not airways dz >> cal balance issues reviewed /  reviewed with pt> needs tsh done if not recent 2) symbicort works so fast it's fine to try off and see what difference if any it makes and if any of her resp symptoms worsen then restart

## 2014-07-23 NOTE — Assessment & Plan Note (Addendum)
Sleep eval requested / wt loss emphasized /needs tsh checked if not done by primary care

## 2014-07-23 NOTE — Assessment & Plan Note (Signed)
Adequate control on present rx, reviewed > no change in rx needed  > rec off acei indefinitely given how far she's come in terms of symptom relief off it and tol arb fine

## 2014-08-31 ENCOUNTER — Ambulatory Visit (INDEPENDENT_AMBULATORY_CARE_PROVIDER_SITE_OTHER): Payer: Medicare Other | Admitting: Pulmonary Disease

## 2014-08-31 ENCOUNTER — Encounter: Payer: Self-pay | Admitting: Pulmonary Disease

## 2014-08-31 VITALS — BP 122/64 | HR 73 | Temp 97.0°F | Ht 69.0 in | Wt 386.2 lb

## 2014-08-31 DIAGNOSIS — Z72821 Inadequate sleep hygiene: Secondary | ICD-10-CM

## 2014-08-31 DIAGNOSIS — G4733 Obstructive sleep apnea (adult) (pediatric): Secondary | ICD-10-CM

## 2014-08-31 HISTORY — DX: Obstructive sleep apnea (adult) (pediatric): G47.33

## 2014-08-31 HISTORY — DX: Inadequate sleep hygiene: Z72.821

## 2014-08-31 NOTE — Patient Instructions (Signed)
I am happy to treat you for your sleep apnea, but I will need to find a copy of your sleep study.  Will also need a commitment from you to sleep 4-5 hours a day everyday. Please check your documentation at home, and call back with the name of your home care company that provided your cpap device.  We will then be able to track down information Once we get the information, we can discuss restarting cpap.

## 2014-08-31 NOTE — Progress Notes (Signed)
Subjective:    Patient ID: Barbara Richardson, female    DOB: [birth year], 44 y.o.   MRN: [id number]  HPI The patient is a 44 year old female who I've been asked to see for management of obstructive sleep apnea. Apparently, she was diagnosed with severe sleep apnea about 2 years ago, but cannot tell us where the study was done or who ordered it. She was started on C Pap, but had the device taken away after 6 months because she was noncompliant. The patient tells me that she never gets more than a few hours of sleep a day, but this is primarily because of her busy lifestyle. She works third shift at night, goes to school during the day, and also runs many errands. She has never committed to getting consistent sleep. She does have a history of loud snoring and witnessed apneas, but surprisingly does not feel that she is overly sleepy during the day. She tells me that her weight is up 100 pounds over the last 2 years, and her Epworth score today is 6   Sleep Questionnaire What time do you typically go to bed?( Between what hours) 1:30a 1:30a at 1402 on 08/31/14 by Inge Rise, CMA How long does it take you to fall asleep? w/ med about 30 min w/ med about 30 min at 1402 on 08/31/14 by Inge Rise, CMA How many times during the night do you wake up? 2 2 at 1402 on 08/31/14 by Inge Rise, CMA What time do you get out of bed to start your day? 0530 0530 at 1402 on 08/31/14 by Inge Rise, CMA Do you drive or operate heavy machinery in your occupation? No No at 1402 on 08/31/14 by Inge Rise, CMA How much has your weight changed (up or down) over the past two years? (In pounds) 100 lb (45.36 kg) 100 lb (45.36 kg) at 1402 on 08/31/14 by Inge Rise, CMA Have you ever had a sleep study before? Yes Yes at 1402 on 08/31/14 by Inge Rise, CMA If yes, location of study? [facility] at 1402 on 08/31/14 by Inge Rise, CMA If yes, date of study? 2 years ago 2 years ago at 1402 on  08/31/14 by Inge Rise, CMA Do you currently use CPAP? No No at 1402 on 08/31/14 by Inge Rise, CMA Do you wear oxygen at any time? No No at 1402 on 08/31/14 by Inge Rise, CMA   Review of Systems  Constitutional: Positive for unexpected weight change. Negative for fever.  HENT: Positive for dental problem and sneezing. Negative for congestion, ear pain, nosebleeds, postnasal drip, rhinorrhea, sinus pressure, sore throat and trouble swallowing.   Eyes: Negative for redness and itching.  Respiratory: Positive for shortness of breath. Negative for cough, chest tightness and wheezing.   Cardiovascular: Negative for palpitations and leg swelling.  Gastrointestinal: Negative for nausea and vomiting.  Genitourinary: Negative for dysuria.  Musculoskeletal: Negative for joint swelling.  Skin: Negative for rash.  Neurological: Positive for headaches.  Hematological: Does not bruise/bleed easily.  Psychiatric/Behavioral: Positive for dysphoric mood. The patient is nervous/anxious.        Objective:   Physical Exam Constitutional:  Morbidly obese female, no acute distress  HENT:  Nares patent without discharge  Oropharynx without exudate, palate and uvula are thick and elongated.   Eyes:  Perrla, eomi, no scleral icterus  Neck:  No JVD, no TMG  Cardiovascular:  Normal rate, regular rhythm, no  rubs or gallops.  2/6 sem        Intact distal pulses  Pulmonary :  Normal breath sounds, no stridor or respiratory distress   No rales, rhonchi, or wheezing  Abdominal:  Soft, nondistended, bowel sounds present.  No tenderness noted.   Musculoskeletal:  + lower extremity edema noted.  Lymph Nodes:  No cervical lymphadenopathy noted  Skin:  No cyanosis noted  Neurologic:  Alert, appropriate, moves all 4 extremities without obvious deficit.         Assessment & Plan:

## 2014-08-31 NOTE — Assessment & Plan Note (Signed)
The patient really does not have a significant issue with actually getting to sleep, but his simply made the lifestyle decision to not sleep. She works third shift, goes to school during the day, and then has many different errands that she has to run. I have asked her to try and make a commitment to getting at least 4-5 hours of sleep a day, and she is not sure that she can do this.

## 2014-08-31 NOTE — Assessment & Plan Note (Signed)
The patient tells me that she has a history of severe obstructive sleep apnea, but cannot remember where she has had her study, who prescribed her device, or who provided her device. She does have paperwork at home that we'll give [place] the name of her home care company, and then hopefully we can track down her sleep study. I have told her there is no reason to restart C Pap unless she is committed to 4-5 hours of sleep with the device a day. She is not sure if she can do this, but will consider.

## 2015-09-07 DIAGNOSIS — J4530 Mild persistent asthma, uncomplicated: Secondary | ICD-10-CM | POA: Diagnosis not present

## 2015-09-07 DIAGNOSIS — R635 Abnormal weight gain: Secondary | ICD-10-CM | POA: Diagnosis not present

## 2015-09-07 DIAGNOSIS — I10 Essential (primary) hypertension: Secondary | ICD-10-CM | POA: Diagnosis not present

## 2015-12-11 DIAGNOSIS — I10 Essential (primary) hypertension: Secondary | ICD-10-CM | POA: Diagnosis not present

## 2015-12-11 DIAGNOSIS — G4733 Obstructive sleep apnea (adult) (pediatric): Secondary | ICD-10-CM | POA: Diagnosis not present

## 2015-12-11 DIAGNOSIS — K219 Gastro-esophageal reflux disease without esophagitis: Secondary | ICD-10-CM | POA: Diagnosis not present

## 2015-12-11 DIAGNOSIS — R0601 Orthopnea: Secondary | ICD-10-CM | POA: Diagnosis not present

## 2015-12-11 DIAGNOSIS — R600 Localized edema: Secondary | ICD-10-CM | POA: Diagnosis not present

## 2015-12-11 DIAGNOSIS — Z6841 Body Mass Index (BMI) 40.0 and over, adult: Secondary | ICD-10-CM | POA: Diagnosis not present

## 2015-12-11 DIAGNOSIS — G43009 Migraine without aura, not intractable, without status migrainosus: Secondary | ICD-10-CM | POA: Diagnosis not present

## 2015-12-11 DIAGNOSIS — Z79899 Other long term (current) drug therapy: Secondary | ICD-10-CM | POA: Diagnosis not present

## 2016-01-31 ENCOUNTER — Inpatient Hospital Stay (HOSPITAL_COMMUNITY)
Admission: AD | Admit: 2016-01-31 | Discharge: 2016-02-01 | Disposition: A | Discharge status: Home / Self Care | Payer: Medicare Other | Source: Ambulatory Visit | Attending: Emergency Medicine | Admitting: Emergency Medicine

## 2016-01-31 ENCOUNTER — Encounter (HOSPITAL_COMMUNITY): Payer: Self-pay | Admitting: *Deleted

## 2016-01-31 DIAGNOSIS — Z6841 Body Mass Index (BMI) 40.0 and over, adult: Secondary | ICD-10-CM | POA: Insufficient documentation

## 2016-01-31 DIAGNOSIS — K219 Gastro-esophageal reflux disease without esophagitis: Secondary | ICD-10-CM | POA: Diagnosis not present

## 2016-01-31 DIAGNOSIS — R0789 Other chest pain: Secondary | ICD-10-CM

## 2016-01-31 DIAGNOSIS — G4733 Obstructive sleep apnea (adult) (pediatric): Secondary | ICD-10-CM | POA: Diagnosis not present

## 2016-01-31 DIAGNOSIS — I10 Essential (primary) hypertension: Secondary | ICD-10-CM | POA: Insufficient documentation

## 2016-01-31 DIAGNOSIS — I4891 Unspecified atrial fibrillation: Secondary | ICD-10-CM | POA: Insufficient documentation

## 2016-01-31 DIAGNOSIS — J45909 Unspecified asthma, uncomplicated: Secondary | ICD-10-CM | POA: Insufficient documentation

## 2016-01-31 DIAGNOSIS — M79602 Pain in left arm: Secondary | ICD-10-CM | POA: Diagnosis not present

## 2016-01-31 DIAGNOSIS — R079 Chest pain, unspecified: Secondary | ICD-10-CM | POA: Diagnosis present

## 2016-01-31 HISTORY — DX: Obesity, unspecified: E66.9

## 2016-01-31 HISTORY — DX: Unspecified atrial fibrillation: I48.91

## 2016-01-31 MED ORDER — ASPIRIN 81 MG PO CHEW
CHEWABLE_TABLET | ORAL | Status: AC
  Filled 2016-01-31: qty 4

## 2016-01-31 NOTE — MAU Note (Signed)
Pt c/o left arm pain and numbness that started around 2am. States that she has hx of atrial flutter-is not currently having palpitations. States that she feels like she has indigestion.

## 2016-01-31 NOTE — MAU Provider Note (Signed)
History     CSN: [id number]  Arrival date and time: 01/31/16 2235   First Provider Initiated Contact with Patient 01/31/16 2300      Chief Complaint  Patient presents with  . Chest Pain   Chest Pain   This is a new problem. The current episode started today. The onset quality is sudden. The problem occurs constantly. The problem has been unchanged. The pain is present in the lateral region. The pain is at a severity of 6/10. The quality of the pain is described as numbness. The pain radiates to the left jaw and left arm. Associated symptoms include shortness of breath (better now). Treatments tried: ASA. The treatment provided no relief. Risk factors include obesity.  Her past medical history is significant for arrhythmia, hypertension and TIA.  Pertinent negatives for past medical history include no diabetes. History of connective tissue disease: a-fib.  Her family medical history is significant for early MI.    Past Medical History:  Diagnosis Date  . Abnormal Pap smear   . Asthma   . BV (bacterial vaginosis) 08/24/2010  . DUB (dysfunctional uterine bleeding) 02/08/2010  . Endometrial polyp 03/2010  . GERD (gastroesophageal reflux disease)   . Hx of chlamydia infection 1995  . Hx of constipation 10/17/2010  . Hx of menorrhagia 03/2010  . Hypertension   . Migraines   . Obesity, morbid (more than 100 lbs over ideal weight or BMI > 40) 2011  . Uterine fibroid 03/2010  . Yeast infection     Past Surgical History:  Procedure Laterality Date  . BREAST REDUCTION SURGERY  1999  . CARPAL TUNNEL RELEASE    . CESAREAN SECTION    . HYSTEROSCOPY W/ ENDOMETRIAL ABLATION  2011  . KNEE SURGERY    . TONSILLECTOMY  2010  . TUBAL LIGATION  1999    Family History  Problem Relation Age of Onset  . Diabetes Father   . Cancer Maternal Grandfather   . Heart disease Maternal Grandmother     Social History  Substance Use Topics  . Smoking status: Never Smoker  . Smokeless tobacco: Former  Systems developer  . Alcohol use No    Allergies: No Known Allergies  Prescriptions Prior to Admission  Medication Sig Dispense Refill Last Dose  . albuterol (PROVENTIL HFA;VENTOLIN HFA) 108 (90 BASE) MCG/ACT inhaler Inhale 2 puffs into the lungs every 6 (six) hours as needed for wheezing. 1 Inhaler 2 Taking  . buPROPion (WELLBUTRIN XL) 300 MG 24 hr tablet Take 1 tablet by mouth daily.  3 Taking  . furosemide (LASIX) 20 MG tablet Take 20 mg by mouth 2 (two) times daily.   Taking  . losartan (COZAAR) 100 MG tablet Take 1 tablet by mouth daily.  3 Taking  . metoprolol succinate (TOPROL-XL) 50 MG 24 hr tablet Take 50 mg by mouth daily. Take with or immediately following a meal.   Taking  . Multiple Vitamin (MULTIVITAMIN) capsule Take 1 capsule by mouth daily.   Taking  . naproxen (NAPROSYN) 500 MG tablet Take 500 mg by mouth 2 (two) times daily with a meal.   Taking  . Omeprazole-Sodium Bicarbonate (ZEGERID) 20-1100 MG CAPS capsule Take 1 capsule by mouth 2 (two) times daily.   Taking  . SUMAtriptan Succinate (IMITREX PO) Take by mouth.     Taking  . tiZANidine (ZANAFLEX) 4 MG capsule Take 4 mg by mouth 3 (three) times daily.   Taking  . Topiramate (TOPAMAX PO) Take 100 mg by mouth at  bedtime.    Taking    Review of Systems  Respiratory: Positive for shortness of breath (better now).   Cardiovascular: Positive for chest pain.  Gastrointestinal: Positive for heartburn.   Physical Exam   Blood pressure 159/75, pulse 84, temperature 98.2 F (36.8 C), temperature source Oral, resp. rate 20, height 5\' 9"  (1.753 m), weight (!) 392 lb (177.8 kg), last menstrual period 01/19/2016, SpO2 98 %.  Physical Exam  Nursing note and vitals reviewed. Constitutional: She is oriented to person, place, and time. She appears well-developed and well-nourished. No distress.  HENT:  Head: Normocephalic.  Cardiovascular: Normal rate.   Respiratory: Effort normal. No respiratory distress.  GI: Soft.  Neurological: She  is alert and oriented to person, place, and time.  Skin: Skin is warm and dry.  Psychiatric: She has a normal mood and affect.    MAU Course  Procedures  MDM 2312: D/W Dr. Jeanell Sparrow at [facility]. Accepts transfer  2351: Patient off the unit with [facility] in stable condition.   Assessment and Plan   1. Left arm pain    Transfer to [facility] for further evaluation   Mathis Bud 01/31/2016, 11:02 PM

## 2016-02-01 ENCOUNTER — Encounter (HOSPITAL_COMMUNITY): Payer: Self-pay | Admitting: Emergency Medicine

## 2016-02-01 ENCOUNTER — Inpatient Hospital Stay (HOSPITAL_COMMUNITY): Payer: Medicare Other

## 2016-02-01 DIAGNOSIS — R079 Chest pain, unspecified: Secondary | ICD-10-CM | POA: Diagnosis not present

## 2016-02-01 LAB — BASIC METABOLIC PANEL
ANION GAP: 5 (ref 5–15)
BUN: 7 mg/dL (ref 6–20)
CALCIUM: 8.6 mg/dL — AB (ref 8.9–10.3)
CHLORIDE: 109 mmol/L (ref 101–111)
CO2: 21 mmol/L — AB (ref 22–32)
Creatinine, Ser: 1.08 mg/dL — ABNORMAL HIGH (ref 0.44–1.00)
GFR calc non Af Amer: >60 mL/min (ref >60)
GLUCOSE: 97 mg/dL (ref 65–99)
POTASSIUM: 3.6 mmol/L (ref 3.5–5.1)
Sodium: 135 mmol/L (ref 135–145)

## 2016-02-01 LAB — CBC
HEMATOCRIT: 38.9 % (ref 36.0–46.0)
HEMOGLOBIN: 12.8 g/dL (ref 12.0–15.0)
MCH: 29.6 pg (ref 26.0–34.0)
MCHC: 32.9 g/dL (ref 30.0–36.0)
MCV: 90 fL (ref 78.0–100.0)
Platelets: 281 10*3/uL (ref 150–400)
RBC: 4.32 MIL/uL (ref 3.87–5.11)
RDW: 13.6 % (ref 11.5–15.5)
WBC: 6.4 10*3/uL (ref 4.0–10.5)

## 2016-02-01 LAB — I-STAT TROPONIN, ED
TROPONIN I, POC: 0 ng/mL (ref 0.00–0.08)
TROPONIN I, POC: 0 ng/mL (ref 0.00–0.08)

## 2016-02-01 LAB — D-DIMER, QUANTITATIVE: D-Dimer, Quant: 0.31 ug/mL-FEU (ref 0.00–0.50)

## 2016-02-01 MED ORDER — KETOROLAC TROMETHAMINE 30 MG/ML IJ SOLN
30.0000 mg | Freq: Once | INTRAMUSCULAR | Status: AC
  Administered 2016-02-01: 30 mg via INTRAVENOUS
  Filled 2016-02-01: qty 1

## 2016-02-01 NOTE — ED Provider Notes (Signed)
[place] DEPT Provider Note   CSN: [id number] Arrival date & time: 01/31/16  2235  First Provider Contact:  None       History   Chief Complaint Chief Complaint  Patient presents with  . Chest Pain    HPI Barbara Richardson is a 45 y.o. female.  HPI    Barbara Richardson is a 45 y.o. female, with a history of obesity, asthma, and A. fib, presenting to the ED with chest pain that began suddenly at around 2 AM this morning. Pain is described as piercing, rates it 6/10, radiates to the left shoulder. Pain has been recurrent over the last few weeks. She suspects it may be stress related. Pain usually improves with relaxation. Pt endorses recent stress due to exams at school. No history of PE/DVT, recent travel, recent surgery/trauma, or any other DVT/PE risk factors. Patient denies fever/chills, nausea/vomiting, diaphoresis, shortness of breath, or any other complaints.     Past Medical History:  Diagnosis Date  . Abnormal Pap smear   . Asthma   . Atrial fibrillation ([place])   . BV (bacterial vaginosis) 08/24/2010  . DUB (dysfunctional uterine bleeding) 02/08/2010  . Endometrial polyp 03/2010  . GERD (gastroesophageal reflux disease)   . Hx of chlamydia infection 1995  . Hx of constipation 10/17/2010  . Hx of menorrhagia 03/2010  . Hypertension   . Migraines   . Obesity   . Obesity, morbid (more than 100 lbs over ideal weight or BMI > 40) ([place]) 2011  . Uterine fibroid 03/2010  . Yeast infection     Patient Active Problem List   Diagnosis Date Noted  . OSA (obstructive sleep apnea) 08/31/2014  . Inadequate sleep hygiene 08/31/2014  . Excessive daytime sleepiness 05/23/2014  . Dyspnea 05/06/2014  . Essential hypertension 10/18/2011  . Asthma 10/18/2011  . GERD (gastroesophageal reflux disease) 10/18/2011    Past Surgical History:  Procedure Laterality Date  . BREAST REDUCTION SURGERY  1999  . CARPAL TUNNEL RELEASE    . CESAREAN SECTION    . HYSTEROSCOPY W/  ENDOMETRIAL ABLATION  2011  . KNEE SURGERY    . TONSILLECTOMY  2010  . TUBAL LIGATION  1999    OB History    Gravida Para Term Preterm AB Living   2 2       2    SAB TAB Ectopic Multiple Live Births                   Home Medications    Prior to Admission medications   Medication Sig Start Date End Date Taking? Authorizing Provider  albuterol (PROVENTIL HFA;VENTOLIN HFA) 108 (90 BASE) MCG/ACT inhaler Inhale 2 puffs into the lungs every 6 (six) hours as needed for wheezing. 04/05/13  Yes Gregor Hams, MD  buPROPion (WELLBUTRIN XL) 300 MG 24 hr tablet Take 1 tablet by mouth daily. 05/10/14  Yes Historical Provider, MD  furosemide (LASIX) 20 MG tablet Take 20 mg by mouth 2 (two) times daily.   Yes Historical Provider, MD  gabapentin (NEURONTIN) 300 MG capsule Take 300 mg by mouth 3 (three) times daily.   Yes Historical Provider, MD  metoprolol succinate (TOPROL-XL) 50 MG 24 hr tablet Take 50 mg by mouth daily. Take with or immediately following a meal.   Yes Historical Provider, MD  naproxen (NAPROSYN) 500 MG tablet Take 500 mg by mouth 2 (two) times daily with a meal.   Yes Historical Provider, MD  predniSONE (DELTASONE) 20 MG tablet Take 20  mg by mouth daily with breakfast.   Yes Historical Provider, MD  tiZANidine (ZANAFLEX) 4 MG capsule Take 4 mg by mouth 3 (three) times daily.   Yes Historical Provider, MD  tiZANidine (ZANAFLEX) 4 MG capsule Take 4 mg by mouth 3 (three) times daily.   Yes Historical Provider, MD  losartan (COZAAR) 100 MG tablet Take 1 tablet by mouth daily. 03/16/14   Historical Provider, MD  Multiple Vitamin (MULTIVITAMIN) capsule Take 1 capsule by mouth daily.    Historical Provider, MD  Omeprazole-Sodium Bicarbonate (ZEGERID) 20-1100 MG CAPS capsule Take 1 capsule by mouth 2 (two) times daily. 05/20/14   Tammy S Parrett, NP  SUMAtriptan Succinate (IMITREX PO) Take by mouth.      Historical Provider, MD  Topiramate (TOPAMAX PO) Take 100 mg by mouth at bedtime.      Historical Provider, MD    Family History Family History  Problem Relation Age of Onset  . Diabetes Father   . Cancer Maternal Grandfather   . Heart disease Maternal Grandmother     Social History Social History  Substance Use Topics  . Smoking status: Never Smoker  . Smokeless tobacco: Former Systems developer  . Alcohol use No     Allergies   Review of patient's allergies indicates no known allergies.   Review of Systems Review of Systems  Constitutional: Negative for chills, diaphoresis and fever.  Respiratory: Negative for cough, chest tightness and shortness of breath.   Cardiovascular: Positive for chest pain. Negative for palpitations and leg swelling.  Gastrointestinal: Negative for abdominal pain, constipation, diarrhea, nausea and vomiting.  Musculoskeletal: Negative for back pain.  Skin: Negative for color change and pallor.  Neurological: Negative for dizziness, syncope, weakness and light-headedness.  All other systems reviewed and are negative.    Physical Exam Updated Vital Signs BP 111/66 (BP Location: Right Arm)   Pulse 78   Temp 98.6 F (37 C) (Oral)   Resp 16   Ht 5\' 9"  (1.753 m)   Wt (!) 177.8 kg   LMP 01/19/2016   SpO2 100%   BMI 57.89 kg/m   Physical Exam  Constitutional: She appears well-developed and well-nourished. No distress.  HENT:  Head: Normocephalic and atraumatic.  Eyes: Conjunctivae are normal.  Neck: Neck supple.  Cardiovascular: Normal rate, regular rhythm, normal heart sounds and intact distal pulses.   Pulmonary/Chest: Effort normal and breath sounds normal. No respiratory distress. She exhibits tenderness (central and left chest).  Abdominal: Soft. There is no tenderness. There is no guarding.  Musculoskeletal: She exhibits edema (Normal for the patient).  Lymphadenopathy:    She has no cervical adenopathy.  Neurological: She is alert.  Skin: Skin is warm and dry. She is not diaphoretic.  Psychiatric: She has a normal mood and  affect. Her behavior is normal.  Nursing note and vitals reviewed.    ED Treatments / Results  Labs (all labs ordered are listed, but only abnormal results are displayed) Labs Reviewed  BASIC METABOLIC PANEL - Abnormal; Notable for the following:       Result Value   CO2 21 (*)    Creatinine, Ser 1.08 (*)    Calcium 8.6 (*)    All other components within normal limits  CBC  D-DIMER, QUANTITATIVE (NOT AT [facility])  I-STAT TROPOININ, ED  Randolm Idol, ED    EKG  EKG Interpretation None       Radiology Dg Chest 2 View  Result Date: 02/01/2016 CLINICAL DATA:  LEFT chest pain for  1 day. History of atrial fibrillation, hypertension. EXAM: CHEST  2 VIEW COMPARISON:  Chest radiograph May 06, 2014 FINDINGS: Cardiomediastinal silhouette is normal. The lungs are clear without pleural effusions or focal consolidations. Trachea projects midline and there is no pneumothorax. Soft tissue planes and included osseous structures are non-suspicious. Large body habitus. IMPRESSION: Normal chest. Electronically Signed   By: Elon Alas M.D.   On: 02/01/2016 01:12    Procedures Procedures (including critical care time)  Medications Ordered in ED Medications  aspirin 81 MG chewable tablet (not administered)  ketorolac (TORADOL) 30 MG/ML injection 30 mg (30 mg Intravenous Given 02/01/16 0250)     Initial Impression / Assessment and Plan / ED Course  I have reviewed the triage vital signs and the nursing notes.  Pertinent labs & imaging results that were available during my care of the patient were reviewed by me and considered in my medical decision making (see chart for details).  Clinical Course    Tiffnay Milke presents with intermittent chest pain for the last 24 hours.  Findings and plan of care discussed with Veryl Speak, MD.   Pain is reproducible on exam. Suspect chest wall pain due to stress and muscle tension. Low suspicion for ACS. HEART score is 1, indicating  low risk for a cardiac event. Wells criteria score is 0, indicating low risk for PE. Upon reexamination, patient states that her pain is completely gone with the Toradol. Patient is noted to be resting comfortably on the stretcher. Patient remained pain-free throughout her time in the ED. Delta troponins negative. Patient to follow up with PCP as soon as possible. The patient was given instructions for home care as well as return precautions. Patient voices understanding of these instructions, accepts the plan, and is comfortable with discharge.    Vitals:   01/31/16 2247 01/31/16 2309 02/01/16 0016 02/01/16 0440  BP: 159/75 136/89 111/66 122/68  Pulse: 84 87 78 (!) 58  Resp: 20  16 13   Temp: 98.2 F (36.8 C)  98.6 F (37 C)   TempSrc: Oral  Oral   SpO2: 98%  100% 100%  Weight: (!) 177.8 kg     Height: 5\' 9"  (1.753 m)         Final Clinical Impressions(s) / ED Diagnoses   Final diagnoses:  Chest wall pain    New Prescriptions New Prescriptions   No medications on file     Lorayne Bender, PA-C 02/01/16 [place], MD 02/02/16 [phone]

## 2016-02-01 NOTE — ED Triage Notes (Signed)
Patient arrives via [facility] from [facility] with complaint of chest pain since yesterday at 0200. States she has chest pain "all of the time". Endorses "a lot of stress recently with school ending". Denies history of major cardiac problems. 324mg  ASA given PTA. IV in place upon arrival.

## 2016-02-01 NOTE — Discharge Instructions (Signed)
You have been seen today for chest pain. Your imaging and lab tests showed no abnormalities. Follow up with PCP as needed. Return to ED should symptoms recur or other concerning issues arise.  May use naproxen or ibuprofen for pain.

## 2016-03-06 DIAGNOSIS — Z23 Encounter for immunization: Secondary | ICD-10-CM | POA: Diagnosis not present

## 2016-03-06 DIAGNOSIS — Z111 Encounter for screening for respiratory tuberculosis: Secondary | ICD-10-CM | POA: Diagnosis not present

## 2016-03-20 ENCOUNTER — Emergency Department (HOSPITAL_COMMUNITY): Payer: Medicare Other

## 2016-03-20 ENCOUNTER — Emergency Department (HOSPITAL_COMMUNITY)
Admission: EM | Admit: 2016-03-20 | Discharge: 2016-03-20 | Disposition: A | Discharge status: Home / Self Care | Payer: Medicare Other | Attending: Emergency Medicine | Admitting: Emergency Medicine

## 2016-03-20 ENCOUNTER — Encounter (HOSPITAL_COMMUNITY): Payer: Self-pay | Admitting: Emergency Medicine

## 2016-03-20 DIAGNOSIS — I951 Orthostatic hypotension: Secondary | ICD-10-CM | POA: Diagnosis not present

## 2016-03-20 DIAGNOSIS — R42 Dizziness and giddiness: Secondary | ICD-10-CM | POA: Diagnosis not present

## 2016-03-20 DIAGNOSIS — E162 Hypoglycemia, unspecified: Secondary | ICD-10-CM | POA: Insufficient documentation

## 2016-03-20 DIAGNOSIS — R55 Syncope and collapse: Secondary | ICD-10-CM | POA: Diagnosis not present

## 2016-03-20 DIAGNOSIS — J45909 Unspecified asthma, uncomplicated: Secondary | ICD-10-CM | POA: Diagnosis not present

## 2016-03-20 LAB — BASIC METABOLIC PANEL
ANION GAP: 10 (ref 5–15)
BUN: 8 mg/dL (ref 6–20)
CALCIUM: 9 mg/dL (ref 8.9–10.3)
CO2: 20 mmol/L — ABNORMAL LOW (ref 22–32)
CREATININE: 0.64 mg/dL (ref 0.44–1.00)
Chloride: 108 mmol/L (ref 101–111)
GFR calc Af Amer: >60 mL/min (ref >60)
GLUCOSE: 41 mg/dL — AB (ref 65–99)
Potassium: 4.3 mmol/L (ref 3.5–5.1)
Sodium: 138 mmol/L (ref 135–145)

## 2016-03-20 LAB — CBC
HCT: 44.2 % (ref 36.0–46.0)
HEMOGLOBIN: 14.1 g/dL (ref 12.0–15.0)
MCH: 28.9 pg (ref 26.0–34.0)
MCHC: 31.9 g/dL (ref 30.0–36.0)
MCV: 90.6 fL (ref 78.0–100.0)
PLATELETS: 228 10*3/uL (ref 150–400)
RBC: 4.88 MIL/uL (ref 3.87–5.11)
RDW: 14.3 % (ref 11.5–15.5)
WBC: 8.1 10*3/uL (ref 4.0–10.5)

## 2016-03-20 LAB — URINE MICROSCOPIC-ADD ON

## 2016-03-20 LAB — URINALYSIS, ROUTINE W REFLEX MICROSCOPIC
BILIRUBIN URINE: NEGATIVE
Glucose, UA: NEGATIVE mg/dL
KETONES UR: NEGATIVE mg/dL
NITRITE: NEGATIVE
PROTEIN: NEGATIVE mg/dL
Specific Gravity, Urine: 1.017 (ref 1.005–1.030)
pH: 6.5 (ref 5.0–8.0)

## 2016-03-20 LAB — TROPONIN I

## 2016-03-20 LAB — POC URINE PREG, ED: PREG TEST UR: NEGATIVE

## 2016-03-20 LAB — CBG MONITORING, ED
GLUCOSE-CAPILLARY: 52 mg/dL — AB (ref 65–99)
GLUCOSE-CAPILLARY: 57 mg/dL — AB (ref 65–99)
Glucose-Capillary: 48 mg/dL — ABNORMAL LOW (ref 65–99)
Glucose-Capillary: 66 mg/dL (ref 65–99)

## 2016-03-20 MED ORDER — SODIUM CHLORIDE 0.9 % IV BOLUS (SEPSIS)
1000.0000 mL | Freq: Once | INTRAVENOUS | Status: AC
  Administered 2016-03-20: 1000 mL via INTRAVENOUS

## 2016-03-20 NOTE — ED Triage Notes (Signed)
Pt via [facility] with c/o sudden onset dizziness and lightheadness with diaphoresis.  Pt was pale and lethargic on EMS arrival.  Pt was orthostatic.  BP laying 110/60, sitting 82/50.  Given 500 mL NS.  Hx of MI in aug.  NAD, A&O.

## 2016-03-20 NOTE — ED Notes (Signed)
Pt CBG resulted at 52 mg/dL. Pt was provided with nourishment and was able to eat and drink. RN and MD notified. Will revaluate per orders.

## 2016-03-20 NOTE — ED Provider Notes (Signed)
[facility] DEPT Provider Note   CSN: [id number] Arrival date & time: 03/20/16  1334     History   Chief Complaint Chief Complaint  Patient presents with  . Near Syncope    HPI Barbara Richardson is a 45 y.o. female.  Pt presents via EMS with a sudden onset of dizziness and lightheadedness.  The pt was orthostatic per EMS.  She was given 500 cc NS bolus and she is feeling better.  Pt said she was sitting at her woodworking class and began to feel like she was going to pass out.  She denies any cp or sob.  EMS was told that pt had a MI in August, but she did not.  She did come to the ED and was eval for cp, but everything was fine and she was d/c'd home.  Pt did take all of her meds today and she did eat breakfast, but had not yet eaten lunch.      Past Medical History:  Diagnosis Date  . Abnormal Pap smear   . Asthma   . Atrial fibrillation ([place])   . BV (bacterial vaginosis) 08/24/2010  . DUB (dysfunctional uterine bleeding) 02/08/2010  . Endometrial polyp 03/2010  . GERD (gastroesophageal reflux disease)   . Hx of chlamydia infection 1995  . Hx of constipation 10/17/2010  . Hx of menorrhagia 03/2010  . Hypertension   . Migraines   . Obesity   . Obesity, morbid (more than 100 lbs over ideal weight or BMI > 40) ([place]) 2011  . Uterine fibroid 03/2010  . Yeast infection     Patient Active Problem List   Diagnosis Date Noted  . OSA (obstructive sleep apnea) 08/31/2014  . Inadequate sleep hygiene 08/31/2014  . Excessive daytime sleepiness 05/23/2014  . Dyspnea 05/06/2014  . Essential hypertension 10/18/2011  . Asthma 10/18/2011  . GERD (gastroesophageal reflux disease) 10/18/2011    Past Surgical History:  Procedure Laterality Date  . BREAST REDUCTION SURGERY  1999  . CARPAL TUNNEL RELEASE    . CESAREAN SECTION    . HYSTEROSCOPY W/ ENDOMETRIAL ABLATION  2011  . KNEE SURGERY    . TONSILLECTOMY  2010  . TUBAL LIGATION  1999    OB History    Gravida Para Term  Preterm AB Living   2 2       2    SAB TAB Ectopic Multiple Live Births                   Home Medications    Prior to Admission medications   Medication Sig Start Date End Date Taking? Authorizing Provider  albuterol (PROVENTIL HFA;VENTOLIN HFA) 108 (90 BASE) MCG/ACT inhaler Inhale 2 puffs into the lungs every 6 (six) hours as needed for wheezing. 04/05/13  Yes Gregor Hams, MD  budesonide-formoterol [facility]) 160-4.5 MCG/ACT inhaler Inhale 2 puffs into the lungs 2 (two) times daily.   Yes Historical Provider, MD  buPROPion (WELLBUTRIN XL) 300 MG 24 hr tablet Take 1 tablet by mouth daily. 05/10/14  Yes Historical Provider, MD  furosemide (LASIX) 20 MG tablet Take 20 mg by mouth 2 (two) times daily.   Yes Historical Provider, MD  gabapentin (NEURONTIN) 300 MG capsule Take 300 mg by mouth 3 (three) times daily.   Yes Historical Provider, MD  losartan (COZAAR) 100 MG tablet Take 1 tablet by mouth daily. 03/16/14  Yes Historical Provider, MD  metoprolol succinate (TOPROL-XL) 50 MG 24 hr tablet Take 50 mg by mouth daily. Take  with or immediately following a meal.   Yes Historical Provider, MD  Multiple Vitamin (MULTIVITAMIN) capsule Take 1 capsule by mouth daily.   Yes Historical Provider, MD  naproxen (NAPROSYN) 500 MG tablet Take 500 mg by mouth 2 (two) times daily with a meal.   Yes Historical Provider, MD  Omeprazole-Sodium Bicarbonate (ZEGERID) 20-1100 MG CAPS capsule Take 1 capsule by mouth 2 (two) times daily. 05/20/14  Yes Tammy S Parrett, NP  polyethylene glycol (MIRALAX / GLYCOLAX) packet Take 17 g by mouth daily as needed for mild constipation.   Yes Historical Provider, MD  predniSONE (DELTASONE) 20 MG tablet Take 20 mg by mouth daily as needed. For bone disease   Yes Historical Provider, MD  Skin Protectants, Misc. (EUCERIN) cream Apply 1 application topically daily as needed for dry skin.   Yes Historical Provider, MD  SUMAtriptan Succinate (IMITREX PO) Take by mouth.     Yes  Historical Provider, MD  tiZANidine (ZANAFLEX) 4 MG capsule Take 4 mg by mouth 3 (three) times daily.   Yes Historical Provider, MD  topiramate (TOPAMAX) 100 MG tablet Take 100 mg by mouth 2 (two) times daily.   Yes Historical Provider, MD  traMADol (ULTRAM) 50 MG tablet Take 50 mg by mouth daily. Patient states she is taking this medication everyday   Yes Historical Provider, MD    Family History Family History  Problem Relation Age of Onset  . Diabetes Father   . Cancer Maternal Grandfather   . Heart disease Maternal Grandmother     Social History Social History  Substance Use Topics  . Smoking status: Never Smoker  . Smokeless tobacco: Former Systems developer  . Alcohol use No     Allergies   Review of patient's allergies indicates no known allergies.   Review of Systems Review of Systems  Constitutional: Positive for diaphoresis.  Neurological: Positive for weakness.  All other systems reviewed and are negative.    Physical Exam Updated Vital Signs BP 135/75   Pulse 64   Resp 18   LMP 03/15/2016 (Exact Date) Comment: tubal ligation  SpO2 99%   Physical Exam  Constitutional: She is oriented to person, place, and time. She appears well-developed and well-nourished.  HENT:  Head: Normocephalic and atraumatic.  Right Ear: External ear normal.  Left Ear: External ear normal.  Nose: Nose normal.  Mouth/Throat: Oropharynx is clear and moist.  Eyes: Conjunctivae and EOM are normal. Pupils are equal, round, and reactive to light.  Neck: Normal range of motion. Neck supple.  Cardiovascular: Normal rate, regular rhythm, normal heart sounds and intact distal pulses.   Pulmonary/Chest: Effort normal and breath sounds normal.  Abdominal: Soft. Bowel sounds are normal.  Musculoskeletal: Normal range of motion.  Neurological: She is alert and oriented to person, place, and time.  Skin: Skin is warm and dry.  Psychiatric: She has a normal mood and affect. Her behavior is normal.  Judgment and thought content normal.  Nursing note and vitals reviewed.    ED Treatments / Results  Labs (all labs ordered are listed, but only abnormal results are displayed) Labs Reviewed  BASIC METABOLIC PANEL - Abnormal; Notable for the following:       Result Value   CO2 20 (*)    Glucose, Bld 41 (*)    All other components within normal limits  URINALYSIS, ROUTINE W REFLEX MICROSCOPIC (NOT AT [facility]) - Abnormal; Notable for the following:    APPearance CLOUDY (*)    Hgb urine dipstick  LARGE (*)    Leukocytes, UA SMALL (*)    All other components within normal limits  URINE MICROSCOPIC-ADD ON - Abnormal; Notable for the following:    Squamous Epithelial / LPF 6-30 (*)    Bacteria, UA FEW (*)    Casts HYALINE CASTS (*)    All other components within normal limits  CBG MONITORING, ED - Abnormal; Notable for the following:    Glucose-Capillary 52 (*)    All other components within normal limits  CBG MONITORING, ED - Abnormal; Notable for the following:    Glucose-Capillary 48 (*)    All other components within normal limits  CBG MONITORING, ED - Abnormal; Notable for the following:    Glucose-Capillary 57 (*)    All other components within normal limits  CBC  TROPONIN I  POC URINE PREG, ED  CBG MONITORING, ED    EKG  EKG Interpretation None     ekg not coming through on muse:  Hr 57.  Sinus brady.  No st or t wave changes.  Radiology Dg Chest 2 View  Result Date: 03/20/2016 CLINICAL DATA:  Acute onset dizziness, lightheadedness and diaphoresis today. EXAM: CHEST  2 VIEW COMPARISON:  PA and lateral chest 02/01/2016 and 05/08/2014. FINDINGS: The lungs are clear. Heart size is normal. No pneumothorax or pleural effusion. No focal bony abnormality. IMPRESSION: Negative chest. Electronically Signed   By: Inge Rise M.D.   On: 03/20/2016 17:04    Procedures Procedures (including critical care time)  Medications Ordered in ED Medications  sodium chloride 0.9  % bolus 1,000 mL (0 mLs Intravenous Stopped 03/20/16 1637)     Initial Impression / Assessment and Plan / ED Course  I have reviewed the triage vital signs and the nursing notes.  Pertinent labs & imaging results that were available during my care of the patient were reviewed by me and considered in my medical decision making (see chart for details).  Clinical Course   Pt has hematuria b/c she is on her period.   Pt said that she has been trying to lose weight, so has only been eating 2 times a day.  She is not on any diabetic meds, so that is probably why she is hypoglycemic.  She is told to eat small, frequent meals.  She was orthostatic and bradycardic, so she is told to hold her bp meds tonight.  She knows to return if worse.    Final Clinical Impressions(s) / ED Diagnoses   Final diagnoses:  Near syncope  Hypoglycemia  Orthostatic hypotension    New Prescriptions New Prescriptions   No medications on file     Isla Pence, MD 03/20/16 1710

## 2016-03-20 NOTE — ED Notes (Signed)
Pt has consumed a total of 24 oz of orange juice, 3 packs of graham crackers, and a bottle of water. Pt daughter also provided the patient with a sandwich from [employer], a chocolate chip cookie, a bag of chips, and a piece of chocolate cake.

## 2016-03-20 NOTE — Discharge Instructions (Signed)
Eat small, frequent meals high in protein.  Drink lots of fluids.  Hold your blood pressure tonight.

## 2016-03-20 NOTE — ED Notes (Signed)
Pt to xray

## 2016-03-25 DIAGNOSIS — I10 Essential (primary) hypertension: Secondary | ICD-10-CM | POA: Diagnosis not present

## 2016-03-25 DIAGNOSIS — J45901 Unspecified asthma with (acute) exacerbation: Secondary | ICD-10-CM | POA: Diagnosis not present

## 2016-03-25 DIAGNOSIS — E162 Hypoglycemia, unspecified: Secondary | ICD-10-CM | POA: Diagnosis not present

## 2016-03-25 DIAGNOSIS — R42 Dizziness and giddiness: Secondary | ICD-10-CM | POA: Diagnosis not present

## 2016-03-30 DIAGNOSIS — L02211 Cutaneous abscess of abdominal wall: Secondary | ICD-10-CM | POA: Diagnosis not present

## 2016-04-01 DIAGNOSIS — R4589 Other symptoms and signs involving emotional state: Secondary | ICD-10-CM | POA: Diagnosis not present

## 2016-04-01 DIAGNOSIS — R42 Dizziness and giddiness: Secondary | ICD-10-CM | POA: Diagnosis not present

## 2016-04-01 DIAGNOSIS — E162 Hypoglycemia, unspecified: Secondary | ICD-10-CM | POA: Diagnosis not present

## 2016-04-10 DIAGNOSIS — I10 Essential (primary) hypertension: Secondary | ICD-10-CM | POA: Diagnosis not present

## 2016-04-10 DIAGNOSIS — E162 Hypoglycemia, unspecified: Secondary | ICD-10-CM | POA: Diagnosis not present

## 2016-04-10 DIAGNOSIS — R42 Dizziness and giddiness: Secondary | ICD-10-CM | POA: Diagnosis not present

## 2016-04-11 DIAGNOSIS — E162 Hypoglycemia, unspecified: Secondary | ICD-10-CM | POA: Diagnosis not present

## 2016-04-11 DIAGNOSIS — F418 Other specified anxiety disorders: Secondary | ICD-10-CM | POA: Diagnosis not present

## 2016-04-11 DIAGNOSIS — Z6841 Body Mass Index (BMI) 40.0 and over, adult: Secondary | ICD-10-CM | POA: Diagnosis not present

## 2016-04-11 DIAGNOSIS — R42 Dizziness and giddiness: Secondary | ICD-10-CM | POA: Diagnosis not present

## 2016-04-11 DIAGNOSIS — Z1231 Encounter for screening mammogram for malignant neoplasm of breast: Secondary | ICD-10-CM | POA: Diagnosis not present

## 2016-04-11 DIAGNOSIS — R946 Abnormal results of thyroid function studies: Secondary | ICD-10-CM | POA: Diagnosis not present

## 2016-04-11 DIAGNOSIS — R1012 Left upper quadrant pain: Secondary | ICD-10-CM | POA: Diagnosis not present

## 2016-04-11 DIAGNOSIS — Z01419 Encounter for gynecological examination (general) (routine) without abnormal findings: Secondary | ICD-10-CM | POA: Diagnosis not present

## 2016-04-11 DIAGNOSIS — Z113 Encounter for screening for infections with a predominantly sexual mode of transmission: Secondary | ICD-10-CM | POA: Diagnosis not present

## 2016-04-11 DIAGNOSIS — J309 Allergic rhinitis, unspecified: Secondary | ICD-10-CM | POA: Diagnosis not present

## 2016-04-12 ENCOUNTER — Other Ambulatory Visit: Payer: Self-pay | Admitting: Obstetrics and Gynecology

## 2016-04-12 DIAGNOSIS — R1012 Left upper quadrant pain: Secondary | ICD-10-CM

## 2016-04-15 ENCOUNTER — Other Ambulatory Visit: Payer: Self-pay | Admitting: Obstetrics and Gynecology

## 2016-04-15 ENCOUNTER — Ambulatory Visit
Admission: RE | Admit: 2016-04-15 | Discharge: 2016-04-15 | Disposition: A | Discharge status: Home / Self Care | Payer: Medicare Other | Source: Ambulatory Visit | Attending: Obstetrics and Gynecology | Admitting: Obstetrics and Gynecology

## 2016-04-15 DIAGNOSIS — R1012 Left upper quadrant pain: Secondary | ICD-10-CM

## 2016-04-15 DIAGNOSIS — L02211 Cutaneous abscess of abdominal wall: Secondary | ICD-10-CM | POA: Diagnosis not present

## 2016-04-22 ENCOUNTER — Ambulatory Visit
Admission: RE | Admit: 2016-04-22 | Discharge: 2016-04-22 | Disposition: A | Discharge status: Home / Self Care | Payer: Medicare Other | Source: Ambulatory Visit | Attending: Obstetrics and Gynecology | Admitting: Obstetrics and Gynecology

## 2016-04-22 DIAGNOSIS — R1012 Left upper quadrant pain: Secondary | ICD-10-CM

## 2016-04-22 MED ORDER — IOPAMIDOL (ISOVUE-300) INJECTION 61%
125.0000 mL | Freq: Once | INTRAVENOUS | Status: AC | PRN
  Administered 2016-04-22: 125 mL via INTRAVENOUS

## 2016-04-23 DIAGNOSIS — R140 Abdominal distension (gaseous): Secondary | ICD-10-CM | POA: Diagnosis not present

## 2016-04-23 DIAGNOSIS — K219 Gastro-esophageal reflux disease without esophagitis: Secondary | ICD-10-CM | POA: Diagnosis not present

## 2016-04-23 DIAGNOSIS — K5900 Constipation, unspecified: Secondary | ICD-10-CM | POA: Diagnosis not present

## 2016-05-01 DIAGNOSIS — Z6841 Body Mass Index (BMI) 40.0 and over, adult: Secondary | ICD-10-CM | POA: Diagnosis not present

## 2016-05-01 DIAGNOSIS — F418 Other specified anxiety disorders: Secondary | ICD-10-CM | POA: Diagnosis not present

## 2016-05-01 DIAGNOSIS — Z713 Dietary counseling and surveillance: Secondary | ICD-10-CM | POA: Diagnosis not present

## 2016-05-01 DIAGNOSIS — R946 Abnormal results of thyroid function studies: Secondary | ICD-10-CM | POA: Diagnosis not present

## 2016-05-01 DIAGNOSIS — G4733 Obstructive sleep apnea (adult) (pediatric): Secondary | ICD-10-CM | POA: Diagnosis not present

## 2016-05-01 DIAGNOSIS — Z8679 Personal history of other diseases of the circulatory system: Secondary | ICD-10-CM | POA: Diagnosis not present

## 2016-05-01 DIAGNOSIS — Z8673 Personal history of transient ischemic attack (TIA), and cerebral infarction without residual deficits: Secondary | ICD-10-CM | POA: Diagnosis not present

## 2016-05-01 DIAGNOSIS — R42 Dizziness and giddiness: Secondary | ICD-10-CM | POA: Diagnosis not present

## 2016-05-02 ENCOUNTER — Other Ambulatory Visit: Payer: Self-pay | Admitting: Family Medicine

## 2016-05-03 ENCOUNTER — Other Ambulatory Visit: Payer: Self-pay | Admitting: Family Medicine

## 2016-05-03 DIAGNOSIS — Z8673 Personal history of transient ischemic attack (TIA), and cerebral infarction without residual deficits: Secondary | ICD-10-CM

## 2016-05-08 ENCOUNTER — Other Ambulatory Visit: Payer: Medicare Other

## 2016-05-13 DIAGNOSIS — R946 Abnormal results of thyroid function studies: Secondary | ICD-10-CM | POA: Diagnosis not present

## 2016-06-03 ENCOUNTER — Ambulatory Visit
Admission: RE | Admit: 2016-06-03 | Discharge: 2016-06-03 | Disposition: A | Discharge status: Home / Self Care | Payer: Medicare Other | Source: Ambulatory Visit | Attending: Family Medicine | Admitting: Family Medicine

## 2016-06-03 DIAGNOSIS — I6523 Occlusion and stenosis of bilateral carotid arteries: Secondary | ICD-10-CM | POA: Diagnosis not present

## 2016-06-03 DIAGNOSIS — Z8673 Personal history of transient ischemic attack (TIA), and cerebral infarction without residual deficits: Secondary | ICD-10-CM

## 2016-07-03 ENCOUNTER — Ambulatory Visit: Payer: Medicare Other | Admitting: Skilled Nursing Facility1

## 2016-07-16 DIAGNOSIS — H903 Sensorineural hearing loss, bilateral: Secondary | ICD-10-CM | POA: Diagnosis not present

## 2016-07-16 DIAGNOSIS — H8143 Vertigo of central origin, bilateral: Secondary | ICD-10-CM | POA: Diagnosis not present

## 2016-07-16 DIAGNOSIS — R42 Dizziness and giddiness: Secondary | ICD-10-CM | POA: Diagnosis not present

## 2016-07-16 DIAGNOSIS — H838X3 Other specified diseases of inner ear, bilateral: Secondary | ICD-10-CM | POA: Diagnosis not present

## 2016-07-31 DIAGNOSIS — R062 Wheezing: Secondary | ICD-10-CM | POA: Diagnosis not present

## 2016-07-31 DIAGNOSIS — Z79899 Other long term (current) drug therapy: Secondary | ICD-10-CM | POA: Diagnosis not present

## 2016-07-31 DIAGNOSIS — I10 Essential (primary) hypertension: Secondary | ICD-10-CM | POA: Diagnosis not present

## 2016-08-02 ENCOUNTER — Other Ambulatory Visit: Payer: Self-pay | Admitting: Family Medicine

## 2016-08-02 ENCOUNTER — Ambulatory Visit
Admission: RE | Admit: 2016-08-02 | Discharge: 2016-08-02 | Disposition: A | Discharge status: Home / Self Care | Payer: Medicare Other | Source: Ambulatory Visit | Attending: Family Medicine | Admitting: Family Medicine

## 2016-08-02 DIAGNOSIS — R062 Wheezing: Secondary | ICD-10-CM

## 2016-08-02 DIAGNOSIS — R05 Cough: Secondary | ICD-10-CM | POA: Diagnosis not present

## 2016-08-08 DIAGNOSIS — I10 Essential (primary) hypertension: Secondary | ICD-10-CM | POA: Diagnosis not present

## 2016-08-08 DIAGNOSIS — G43009 Migraine without aura, not intractable, without status migrainosus: Secondary | ICD-10-CM | POA: Diagnosis not present

## 2016-08-08 DIAGNOSIS — G4719 Other hypersomnia: Secondary | ICD-10-CM | POA: Diagnosis not present

## 2016-08-08 DIAGNOSIS — Z8673 Personal history of transient ischemic attack (TIA), and cerebral infarction without residual deficits: Secondary | ICD-10-CM | POA: Diagnosis not present

## 2016-08-08 DIAGNOSIS — G4733 Obstructive sleep apnea (adult) (pediatric): Secondary | ICD-10-CM | POA: Diagnosis not present

## 2016-08-08 DIAGNOSIS — Z8679 Personal history of other diseases of the circulatory system: Secondary | ICD-10-CM | POA: Diagnosis not present

## 2016-08-14 ENCOUNTER — Ambulatory Visit: Payer: Medicare Other | Admitting: Neurology

## 2016-08-15 ENCOUNTER — Ambulatory Visit: Payer: Medicare Other | Admitting: Internal Medicine

## 2016-08-22 ENCOUNTER — Other Ambulatory Visit (HOSPITAL_BASED_OUTPATIENT_CLINIC_OR_DEPARTMENT_OTHER): Payer: Self-pay

## 2016-08-22 DIAGNOSIS — G4730 Sleep apnea, unspecified: Secondary | ICD-10-CM

## 2016-08-22 DIAGNOSIS — R5383 Other fatigue: Secondary | ICD-10-CM

## 2016-08-22 DIAGNOSIS — G4710 Hypersomnia, unspecified: Secondary | ICD-10-CM

## 2016-08-22 DIAGNOSIS — R0683 Snoring: Secondary | ICD-10-CM

## 2016-08-28 ENCOUNTER — Ambulatory Visit (HOSPITAL_BASED_OUTPATIENT_CLINIC_OR_DEPARTMENT_OTHER): Payer: Medicare Other | Attending: Family Medicine | Admitting: Internal Medicine

## 2016-08-28 DIAGNOSIS — R0683 Snoring: Secondary | ICD-10-CM | POA: Diagnosis not present

## 2016-08-28 DIAGNOSIS — G4719 Other hypersomnia: Secondary | ICD-10-CM | POA: Insufficient documentation

## 2016-08-28 DIAGNOSIS — R5383 Other fatigue: Secondary | ICD-10-CM | POA: Insufficient documentation

## 2016-08-28 DIAGNOSIS — G4733 Obstructive sleep apnea (adult) (pediatric): Secondary | ICD-10-CM | POA: Diagnosis not present

## 2016-09-05 ENCOUNTER — Other Ambulatory Visit (HOSPITAL_BASED_OUTPATIENT_CLINIC_OR_DEPARTMENT_OTHER): Payer: Self-pay

## 2016-09-05 DIAGNOSIS — G4730 Sleep apnea, unspecified: Secondary | ICD-10-CM

## 2016-09-05 DIAGNOSIS — R0683 Snoring: Secondary | ICD-10-CM

## 2016-09-05 DIAGNOSIS — R5383 Other fatigue: Secondary | ICD-10-CM

## 2016-09-05 DIAGNOSIS — G4710 Hypersomnia, unspecified: Secondary | ICD-10-CM

## 2016-09-07 DIAGNOSIS — G4733 Obstructive sleep apnea (adult) (pediatric): Secondary | ICD-10-CM | POA: Diagnosis not present

## 2016-09-07 NOTE — Procedures (Signed)
  Patient Name: Barbara Richardson, Barbara Richardson Date: 08/28/2016 Gender: Female D.O.B: [birth year] Age (years): 46 Referring Provider: Carlos Levering Height (inches): 36 Interpreting Physician: Baird Lyons MD, ABSM Weight (lbs): 389 RPSGT: Jacolyn Reedy BMI: 56 MRN: [id number] Neck Size: 19.50 CLINICAL INFORMATION Sleep Study Type: unattended HST     Indication for sleep study: Excessive Daytime Sleepiness, Fatigue, Snoring     Epworth Sleepiness Score: 21  SLEEP STUDY TECHNIQUE A multi-channel overnight portable sleep study was performed. The channels recorded were: nasal airflow, thoracic respiratory movement, and oxygen saturation with a pulse oximetry. Snoring was also monitored.  MEDICATIONS Patient self administered medications include: none reported during study.  SLEEP ARCHITECTURE Patient was studied for 494.5 minutes. The sleep efficiency was 98.7 % and the patient was supine for 99.3%. The arousal index was 0.0 per hour.  RESPIRATORY PARAMETERS The overall AHI was 5.5 per hour, with a central apnea index of 0.0 per hour.  The oxygen nadir was 86% during sleep.  CARDIAC DATA Mean heart rate during sleep was 64.5 bpm.  IMPRESSIONS - Mild obstructive sleep apnea occurred during this study (AHI = 5.5/h). - No significant central sleep apnea occurred during this study (CAI = 0.0/h). - Moderate oxygen desaturation was noted during this study (Min O2 = 86%). - Patient snored.  DIAGNOSIS - Obstructive Sleep Apnea (327.23 [G47.33 ICD-10])  RECOMMENDATIONS - Return to ordering provider to discuss management options for very mild obstructive sleep apnea - Avoid alcohol, sedatives and other CNS depressants that may worsen sleep apnea and disrupt normal sleep architecture. - Sleep hygiene should be reviewed to assess factors that may improve sleep quality. - Weight management and regular exercise should be initiated or continued.  [Electronically signed]  09/07/2016 11:36 AM  Baird Lyons MD, ABSM Diplomate, American Board of Sleep Medicine   NPI: [id number]  [employer], American Board of Sleep Medicine  ELECTRONICALLY SIGNED ON:  09/07/2016, 11:36 AM [place] PH: [phone]   FX: [phone] [place]

## 2016-10-07 ENCOUNTER — Ambulatory Visit: Payer: Medicare Other | Admitting: Neurology

## 2016-11-12 ENCOUNTER — Other Ambulatory Visit: Payer: Self-pay | Admitting: Family Medicine

## 2016-11-12 ENCOUNTER — Ambulatory Visit
Admission: RE | Admit: 2016-11-12 | Discharge: 2016-11-12 | Disposition: A | Discharge status: Home / Self Care | Payer: Medicare Other | Source: Ambulatory Visit | Attending: Family Medicine | Admitting: Family Medicine

## 2016-11-12 DIAGNOSIS — M47812 Spondylosis without myelopathy or radiculopathy, cervical region: Secondary | ICD-10-CM | POA: Diagnosis not present

## 2016-11-12 DIAGNOSIS — M79601 Pain in right arm: Secondary | ICD-10-CM | POA: Diagnosis not present

## 2016-11-12 DIAGNOSIS — M79621 Pain in right upper arm: Secondary | ICD-10-CM

## 2016-11-12 DIAGNOSIS — M25511 Pain in right shoulder: Secondary | ICD-10-CM | POA: Diagnosis not present

## 2016-12-10 ENCOUNTER — Other Ambulatory Visit: Payer: Self-pay | Admitting: Family Medicine

## 2016-12-10 DIAGNOSIS — M79601 Pain in right arm: Secondary | ICD-10-CM

## 2016-12-10 DIAGNOSIS — M5412 Radiculopathy, cervical region: Secondary | ICD-10-CM

## 2016-12-26 ENCOUNTER — Other Ambulatory Visit: Payer: Medicare Other

## 2017-01-03 ENCOUNTER — Ambulatory Visit
Admission: RE | Admit: 2017-01-03 | Discharge: 2017-01-03 | Disposition: A | Discharge status: Home / Self Care | Payer: Medicare Other | Source: Ambulatory Visit | Attending: Family Medicine | Admitting: Family Medicine

## 2017-01-03 DIAGNOSIS — M79601 Pain in right arm: Secondary | ICD-10-CM

## 2017-01-03 DIAGNOSIS — M4802 Spinal stenosis, cervical region: Secondary | ICD-10-CM | POA: Diagnosis not present

## 2017-01-03 DIAGNOSIS — M5412 Radiculopathy, cervical region: Secondary | ICD-10-CM

## 2017-01-13 DIAGNOSIS — M4802 Spinal stenosis, cervical region: Secondary | ICD-10-CM | POA: Diagnosis not present

## 2017-01-13 DIAGNOSIS — I10 Essential (primary) hypertension: Secondary | ICD-10-CM | POA: Diagnosis not present

## 2017-01-13 DIAGNOSIS — M5020 Other cervical disc displacement, unspecified cervical region: Secondary | ICD-10-CM | POA: Diagnosis not present

## 2017-01-13 DIAGNOSIS — Z6841 Body Mass Index (BMI) 40.0 and over, adult: Secondary | ICD-10-CM | POA: Diagnosis not present

## 2017-05-29 ENCOUNTER — Ambulatory Visit (INDEPENDENT_AMBULATORY_CARE_PROVIDER_SITE_OTHER): Payer: Medicare Other | Admitting: Internal Medicine

## 2017-05-29 ENCOUNTER — Encounter: Payer: Self-pay | Admitting: Internal Medicine

## 2017-05-29 ENCOUNTER — Other Ambulatory Visit (INDEPENDENT_AMBULATORY_CARE_PROVIDER_SITE_OTHER): Payer: Medicare Other

## 2017-05-29 VITALS — BP 122/72 | HR 80 | Ht 67.0 in | Wt 349.0 lb

## 2017-05-29 DIAGNOSIS — J45991 Cough variant asthma: Secondary | ICD-10-CM

## 2017-05-29 DIAGNOSIS — R0609 Other forms of dyspnea: Secondary | ICD-10-CM

## 2017-05-29 LAB — BRAIN NATRIURETIC PEPTIDE: Pro B Natriuretic peptide (BNP): 13 pg/mL (ref 0.0–100.0)

## 2017-05-29 LAB — POCT EXHALED NITRIC OXIDE: FeNO level (ppb): 36

## 2017-05-29 NOTE — Progress Notes (Signed)
Subjective:    Patient ID: Barbara Richardson, female    DOB: 1971/01/09  MRN: [id number]     Brief patient profile:  46  yobf with dx of asthma as child with lots of "bronchitis but outgrew by age 46, no trouble with 1st pregnancy  Then around 1999  At wt 235 started noted breathing problems after 2nd pregancy around 2000  p 95 lbs  Breathing started getting getting worse  and started needing albuterol > allergy eval in [facility] does not remember results Pos but no shots apparently rec referred to pulmonary clinic 05/06/2014 by Jackson Latino with worseing cough and sob refractory to symbicort since late 2014 with pfts nl 12 h p last symbicort  on 07/20/2014      History of Present Illness  05/06/2014 1st [facility] office visit/ Barbara Richardson  ? Recently on ACEi ? Has really stopped?   Chief Complaint  Patient presents with  . Pulmonary Consult    Referred by Dr. Lin Landsman. Pt c/o cough and SOB  since "always"- worse for the past year.  She states that she is SOB "all the time"- with or without any exertion. Her cough is prod with minimal green sputum. She has a rescue inhaler but does not know how often she uses this, instread uses symbicort 3-4 times per day  went from bad to worse x one year daily > noct symptoms of cough and sob at rest and thoroughly confused with maint vs prns >>d/c ACEi  and symbicort rx   05/20/2014 follow-up and medication review Patient returns for a two-week follow-up She was seen last visit for primary consult for asthma with shortness of breath and cough She was recommended to stop her ACE inhibitor Begin Symbicort twice daily We reviewed all her medications organize them into a medication count with patient education Since last visit. Does feel that she has improved Her cough is nearly resolved rec -Remain off of Lisinopril .  -Continue on symbicort 160 Take 2 puffs first thing in am and then another 2 puffs about 12 hours later.  -Only use your albuterol   as a rescue medication to be used if you can't catch your breath by resting or doing a relaxed purse lip breathing pattern.  - The less you use it, the better it will work when you need it. - Ok to use up to 2 puffs  every 4 hours if you must but call for immediate appointment if use goes up over your usual need - Don't leave home without it !!  (think of it like the spare tire for your car)  -Continue on Zegerd before bfast and supper. -Follow up Dr. Melvyn Novas  In 2 months for PFT  Follow up with family doctor for your blood pressure Avoid ACE inhibitors in future       07/20/2014 f/u ov/Barbara Richardson re: ? Asthma vs pseudoasthma from ACEi, > much better since stopped  Chief Complaint  Patient presents with  . Follow-up    with PFT. Pt states cough and SOB is better since last OV.    Not limited by breathing from desired activities  But relatively sendentary, not using saba at all, did not use symbicort am of ov, no longer consistent with ppi rec Ok to leave off symbicort but if breathing or cough get worse for any reason start it back up  Weight control is simply a matter of calorie balance    05/29/2017  Re-establish ov/Barbara Richardson re: consult re  sob/ cough  On symbicort 160 2bid/singulair  Chief Complaint  Patient presents with  . Pulmonary Consult    Referred by Dr. Dorthy Cooler. Pt c/o chest congestion and increased SOB for the past year. She states she gets winded walking up stairs. She is coughing with clear sputum.    noted p tried off symb p last ov and  worse p about a month > symbicort 160 2bid and improved  Uses [facility] parking due to legs and breathing s progression Cough and congestion esp if gets hot  Takes omeprazole 40 mg at hs with overt HB daytime Sleeps on bunch of pillows up about 30 degrees/ o/w cough and choke  Sob assoc with subj wheeze better on pred/ ? Worse p exp to her dog  Overall activity tol gradual downward trend x one year despite wt loss intentional    No obvious other  day  to day or daytime variability or assoc excess/ purulent sputum or mucus plugs or hemoptysis or cp or chest tightness, or overt sinus  symptoms. No unusual exposure hx or h/o childhood pna/ asthma or knowledge of premature birth.  Sleeping ok 30 degrees  without nocturnal  or early am exacerbation  of respiratory  c/o's or need for noct saba. Also denies any obvious fluctuation of symptoms with weather or environmental changes or other aggravating or alleviating factors except as outlined above   Current Allergies, Complete Past Medical History, Past Surgical History, Family History, and Social History were reviewed in [employer] record.  ROS  The following are not active complaints unless bolded Hoarseness, sore throat, dysphagia, dental problems, itching, sneezing,  nasal congestion or discharge of excess mucus or purulent secretions, ear ache,   fever, chills, sweats, unintended wt loss or wt gain, classically pleuritic or exertional cp,  orthopnea pnd or leg swelling, presyncope, palpitations, abdominal pain, anorexia, nausea, vomiting, diarrhea  or change in bowel habits or change in bladder habits, change in stools or change in urine, dysuria, hematuria,  rash, arthralgias, visual complaints, headache, numbness, weakness or ataxia or problems with walking or coordination,  change in mood/affect or memory.        Current Meds  Medication Sig  . albuterol (PROVENTIL HFA;VENTOLIN HFA) 108 (90 BASE) MCG/ACT inhaler Inhale 2 puffs into the lungs every 6 (six) hours as needed for wheezing.  . beta carotene 25000 UNIT capsule Take 25,000 Units by mouth daily.  . budesonide-formoterol (SYMBICORT) 160-4.5 MCG/ACT inhaler Inhale 2 puffs into the lungs 2 (two) times daily.  Marland Kitchen buPROPion (WELLBUTRIN XL) 300 MG 24 hr tablet Take 1 tablet by mouth daily.  . Cyanocobalamin (B-12 PO) Take 1 tablet by mouth daily.  Marland Kitchen docusate sodium (COLACE) 100 MG capsule Take 100 mg by mouth daily.  .  Flax OIL Take 1 capsule by mouth daily.  Marland Kitchen FLUoxetine (PROZAC) 20 MG tablet Take 20 mg by mouth daily.  . furosemide (LASIX) 20 MG tablet Take 20 mg by mouth 2 (two) times daily.  Marland Kitchen gabapentin (NEURONTIN) 300 MG capsule Take 300 mg by mouth 3 (three) times daily.  Marland Kitchen losartan (COZAAR) 100 MG tablet Take 1 tablet by mouth daily.  Marland Kitchen LYSINE PO Take 1 capsule by mouth daily.  . metoprolol succinate (TOPROL-XL) 50 MG 24 hr tablet Take 50 mg by mouth daily. Take with or immediately following a meal.  . montelukast (SINGULAIR) 10 MG tablet Take 10 mg by mouth at bedtime.  . Multiple Vitamin (MULTIVITAMIN) capsule Take 1 capsule  by mouth daily.  . naproxen (NAPROSYN) 500 MG tablet Take 500 mg by mouth 2 (two) times daily with a meal.  . omeprazole (PRILOSEC) 40 MG capsule Take 40 mg by mouth daily.  . polyethylene glycol (MIRALAX / GLYCOLAX) packet Take 17 g by mouth daily as needed for mild constipation.  . predniSONE (DELTASONE) 20 MG tablet Take 20 mg by mouth daily as needed. For bone disease  . Skin Protectants, Misc. (EUCERIN) cream Apply 1 application topically daily as needed for dry skin.  . SUMAtriptan Succinate (IMITREX PO) Take by mouth.    Marland Kitchen tiZANidine (ZANAFLEX) 4 MG capsule Take 4 mg by mouth 3 (three) times daily.  Marland Kitchen topiramate (TOPAMAX) 100 MG tablet Take 100 mg by mouth 2 (two) times daily.  . traMADol (ULTRAM) 50 MG tablet Take 50 mg by mouth daily. Patient states she is taking this medication everyday                    Objective:   Physical Exam   amb obese bf nad   05/29/2017      349   07/20/14 379 lb (171.913 kg)  05/20/14 375 lb 6.4 oz (170.28 kg)  05/06/14 376 lb 6.4 oz (170.734 kg)    Vital signs reviewed - Note on arrival 02 sats  100% on RA   HEENT: nl dentition, turbinates bilaterally, and oropharynx. Nl external ear canals without cough reflex   NECK :  without JVD/Nodes/TM/ nl carotid upstrokes bilaterally   LUNGS: no acc muscle use,  Nl contour  chest with classic pseudowheeze    CV:  RRR  no s3 or murmur or increase in P2, and no edema   ABD:  soft and nontender with nl inspiratory excursion in the supine position. No bruits or organomegaly appreciated, bowel sounds nl  MS:  Nl gait/ ext warm without deformities, calf tenderness, cyanosis or clubbing No obvious joint restrictions   SKIN: warm and dry without lesions    NEURO:  alert, approp, nl sensorium with  no motor or cerebellar deficits apparent.      Outside labs reviewed CBC 04/04/17 Eos  0.6  bmet nl    Labs ordered/ reviewed:    Allergy profile > see a/p section   Lab Results  Component Value Date   DDIMER 0.36 05/29/2017       Lab Results  Component Value Date   PROBNP 13.0 05/29/2017                       Assessment & Plan:

## 2017-05-29 NOTE — Patient Instructions (Addendum)
Plan A = Automatic = Symbicort 160 Take 2 puffs first thing in am and then another 2 puffs about 12 hours later.   Work on Doctor, hospital technique:  relax and gently blow all the way out then take a nice smooth deep breath back in, triggering the inhaler at same time you start breathing in.  Hold for up to 5 seconds if you can. Blow out thru nose. Rinse and gargle with water when done      Plan B = Backup Only use your albuterol as a rescue medication to be used if you can't catch your breath by resting or doing a relaxed purse lip breathing pattern.  - The less you use it, the better it will work when you need it. - Ok to use the inhaler up to 2 puffs  every 4 hours if you must but call for appointment if use goes up over your usual need - Don't leave home without it !!  (think of it like the spare tire for your car)    Change omeprazole to Take 30- 60 min before your first and last meals of the day   GERD (REFLUX)  is an extremely common cause of respiratory symptoms just like yours , many times with no obvious heartburn at all.    It can be treated with medication, but also with lifestyle changes including elevation of the head of your bed (ideally with 6 inch  bed blocks),  Smoking cessation, avoidance of late meals, excessive alcohol, and avoid fatty foods, chocolate, peppermint, colas, red wine, and acidic juices such as orange juice.  NO MINT OR MENTHOL PRODUCTS SO NO COUGH DROPS   USE SUGARLESS CANDY INSTEAD (Jolley ranchers or Stover's or Life Savers) or even ice chips will also do - the key is to swallow to prevent all throat clearing. NO OIL BASED VITAMINS - use powdered substitutes.    Please schedule a follow up office visit in 6 weeks, call sooner if needed with pfts on return but don't take any symbicort that day or the night before - just use your ventolin up to 6 hours before the test if you must

## 2017-05-30 LAB — RESPIRATORY ALLERGY PROFILE REGION II ~~LOC~~
Allergen, A. alternata, m6: <0.1 kU/L
Allergen, D pternoyssinus,d7: 0.27 kU/L — ABNORMAL HIGH
Allergen, Mouse Urine Protein, e78: <0.1 kU/L
Allergen, P. notatum, m1: <0.1 kU/L
Box Elder IgE: <0.1 kU/L
CLASS: 0
CLASS: 0
CLASS: 0
CLASS: 0
CLASS: 0
CLASS: 0
CLASS: 0
CLASS: 0
CLASS: 0
CLASS: 0
CLASS: 0
CLASS: 0
Class: 0
Class: 0
Class: 0
Class: 0
Class: 0
Class: 0
Class: 0
Class: 0
Class: 0
Class: 0
Class: 0
Class: 0
D. farinae: 0.28 kU/L — ABNORMAL HIGH
Elm IgE: <0.1 kU/L
IgE (Immunoglobulin E), Serum: 172 kU/L — ABNORMAL HIGH (ref <=114)
Johnson Grass: <0.1 kU/L
Timothy Grass: <0.1 kU/L

## 2017-05-30 LAB — D-DIMER, QUANTITATIVE (NOT AT ARMC): D DIMER QUANT: 0.36 ug{FEU}/mL (ref <0.50)

## 2017-05-30 LAB — INTERPRETATION:

## 2017-05-31 ENCOUNTER — Encounter: Payer: Self-pay | Admitting: Internal Medicine

## 2017-05-31 HISTORY — DX: Morbid (severe) obesity due to excess calories: E66.01

## 2017-05-31 NOTE — Assessment & Plan Note (Signed)
05/29/2017  After extensive coaching HFA effectiveness =    90% > continue symb 160 2bid - Allergy profile 05/29/17  >  Eos 0.6 /  IgE  172  RAST pos mild mold only  - FENO 05/29/2017  =   36 - 05/29/2017  After extensive coaching HFA effectiveness =    75% from a baseline of 25%   Reported response to prednisone and indeterminate range FENO on high dose symb/singulair do suggest an asthma component so rec continue max rx and return in 6 weeks with pfts prior to am symbicort and repeat feno with adequate hfa demonstrated but hold further pred for now.   Total time devoted to counseling  > 50 % of   60 min office consultaton/ re-establish :  review case with pt/ discussion of options/alternatives/ personally creating written customized instructions  in presence of pt  then going over those specific  Instructions directly with the pt including how to use all of the meds but in particular covering each new medication in detail and the difference between the maintenance= "automatic" meds and the prns using an action plan format for the latter (If this problem/symptom => do that organization reading Left to right).  Please see AVS from this visit for a full list of these instructions which I personally wrote for this pt and  are unique to this visit.

## 2017-05-31 NOTE — Assessment & Plan Note (Signed)
Body mass index is 54.66 kg/m.  -  trending down/ encouraged No results found for: TSH   Contributing to gerd risk/ doe/reviewed the need and the process to achieve and maintain neg calorie balance > defer f/u primary care including intermittently monitoring thyroid status

## 2017-05-31 NOTE — Assessment & Plan Note (Signed)
-   trial off all acei 05/06/2014  > improved 07/20/14 with nl pfts x low erv > try off symbicort  - 05/29/2017  Walked RA x 3 laps @ 185 ft each stopped due to  End of study, fast pace, leg pain and sob and end with sats down to 90%      Symptoms are markedly disproportionate to objective findings and not clear this is all  a  lung problem but pt does appear to have difficult to sort out respiratory symptoms of unknown origin for which  DDX  = almost all start with A and  include Adherence, Ace Inhibitors, Acid Reflux, Active Sinus Disease, Alpha 1 Antitripsin deficiency, Anxiety masquerading as Airways dz,  ABPA,  Allergy(esp in young), Aspiration (esp in elderly), Adverse effects of meds,  Active smokers, A bunch of PE's (a small clot burden can't cause this syndrome unless there is already severe underlying pulm or vascular dz with poor reserve) plus two Bs  = Bronchiectasis and Beta blocker use..and one C= CHF    Adherence is always the initial "prime suspect" and is a multilayered concern that requires a "trust but verify" approach in every patient - starting with knowing how to use medications, especially inhalers, correctly, keeping up with refills and understanding the fundamental difference between maintenance and prns vs those medications only taken for a very short course and then stopped and not refilled.  - see hfa teaching  - return with all meds in hand using a trust but verify approach to confirm accurate Medication  Reconciliation The principal here is that until we are certain that the  patients are doing what we've asked, it makes no sense to ask them to do more.    ? Acid (or non-acid) GERD > always difficult to exclude as up to 75% of pts in some series report no assoc GI/ Heartburn symptoms> rec max (24h)  acid suppression and diet restrictions/ reviewed and instructions given in writing.    ? Allergy/ asthma > see sep a/p and continue high dose symbicort and singulair for now   ?  Anxiety/depression/ obesity/deconditioning  > usually at the bottom of this list of usual suspects but should be much higher on this pt's based on H and P   ? A bunch of PE's > D dimer nl - while  A nl valute  may miss small peripheral pe, the clot burden with sob is moderately high and the d dimer has a very high neg pred value in this setting  Though she remains at high risk  ? CHF > excluded with such a low BNP

## 2017-06-02 NOTE — Progress Notes (Signed)
LMTCB

## 2017-07-15 ENCOUNTER — Ambulatory Visit: Payer: Medicare Other | Admitting: Internal Medicine

## 2017-07-21 ENCOUNTER — Ambulatory Visit: Payer: Medicare Other | Admitting: Internal Medicine

## 2017-07-25 ENCOUNTER — Other Ambulatory Visit: Payer: Self-pay | Admitting: Family Medicine

## 2017-07-25 ENCOUNTER — Ambulatory Visit
Admission: RE | Admit: 2017-07-25 | Discharge: 2017-07-25 | Disposition: A | Discharge status: Home / Self Care | Payer: Medicare Other | Source: Ambulatory Visit | Attending: Family Medicine | Admitting: Family Medicine

## 2017-07-25 DIAGNOSIS — J4541 Moderate persistent asthma with (acute) exacerbation: Secondary | ICD-10-CM

## 2017-08-04 ENCOUNTER — Ambulatory Visit: Payer: Medicare Other | Admitting: Internal Medicine

## 2017-08-11 ENCOUNTER — Ambulatory Visit (INDEPENDENT_AMBULATORY_CARE_PROVIDER_SITE_OTHER): Payer: Medicare Other | Admitting: Internal Medicine

## 2017-08-11 ENCOUNTER — Encounter: Payer: Self-pay | Admitting: Internal Medicine

## 2017-08-11 DIAGNOSIS — R05 Cough: Secondary | ICD-10-CM

## 2017-08-11 DIAGNOSIS — J45991 Cough variant asthma: Secondary | ICD-10-CM

## 2017-08-11 DIAGNOSIS — R059 Cough, unspecified: Secondary | ICD-10-CM

## 2017-08-11 LAB — PULMONARY FUNCTION TEST
DL/VA % pred: 128 %
DL/VA: 6.64 ml/min/mmHg/L
DLCO unc % pred: 108 %
DLCO unc: 30.7 ml/min/mmHg
FEF 25-75 POST: 1.43 L/s
FEF 25-75 PRE: 1.12 L/s
FEF2575-%Change-Post: 27 %
FEF2575-%PRED-POST: 52 %
FEF2575-%Pred-Pre: 40 %
FEV1-%CHANGE-POST: 7 %
FEV1-%PRED-POST: 77 %
FEV1-%PRED-PRE: 72 %
FEV1-POST: 2.04 L
FEV1-PRE: 1.91 L
FEV1FVC-%Change-Post: 6 %
FEV1FVC-%PRED-PRE: 83 %
FEV6-%CHANGE-POST: 2 %
FEV6-%PRED-POST: 86 %
FEV6-%Pred-Pre: 84 %
FEV6-Post: 2.78 L
FEV6-Pre: 2.71 L
FEV6FVC-%CHANGE-POST: 2 %
FEV6FVC-%Pred-Post: 102 %
FEV6FVC-%Pred-Pre: 99 %
FVC-%CHANGE-POST: 0 %
FVC-%Pred-Post: 84 %
FVC-%Pred-Pre: 84 %
FVC-Post: 2.78 L
FVC-Pre: 2.77 L
POST FEV1/FVC RATIO: 73 %
PRE FEV6/FVC RATIO: 98 %
Post FEV6/FVC ratio: 100 %
Pre FEV1/FVC ratio: 69 %
RV % PRED: 84 %
RV: 1.6 L
TLC % PRED: 83 %
TLC: 4.57 L

## 2017-08-11 NOTE — Patient Instructions (Addendum)
Plan A = Automatic = Singulair daily and symbicort 160 Take 2 puffs first thing in am and then another 2 puffs about 12 hours later.   Omeprazole is 30- 60 min before your first and last meals of the day    Work on inhaler technique:  relax and gently blow all the way out then take a nice smooth deep breath back in, triggering the inhaler at same time you start breathing in.  Hold for up to 5 seconds if you can. Blow out thru nose. Rinse and gargle with water when done     Plan B = Backup Only use your albuterol as a rescue medication to be used if you can't catch your breath by resting or doing a relaxed purse lip breathing pattern.  - The less you use it, the better it will work when you need it. - Ok to use the inhaler up to 2 puffs  every 4 hours if you must but call for appointment if use goes up over your usual need - Don't leave home without it !!  (think of it like the spare tire for your car)     Plan C = Crisis - only use your albuterol nebulizer if you first try Plan B and it fails to help > ok to use the nebulizer up to every 4 hours but if start needing it regularly call for immediate appointment   GERD (REFLUX)  is an extremely common cause of respiratory symptoms just like yours , many times with no obvious heartburn at all.    It can be treated with medication, but also with lifestyle changes including elevation of the head of your bed (ideally with 6 inch  bed blocks),  Smoking cessation, avoidance of late meals, excessive alcohol, and avoid fatty foods, chocolate, peppermint, colas, red wine, and acidic juices such as orange juice.  NO MINT OR MENTHOL PRODUCTS SO NO COUGH DROPS   USE SUGARLESS CANDY INSTEAD (Jolley ranchers or Stover's or Life Savers) or even ice chips will also do - the key is to swallow to prevent all throat clearing. NO OIL BASED VITAMINS - use powdered substitutes.   Please see patient coordinator before you leave today  to schedule sinus  ct   Please schedule a follow up office visit in 4 weeks, sooner if needed  with all medications /inhalers/ solutions in hand so we can verify exactly what you are taking. This includes all medications from all doctors and over the counters

## 2017-08-11 NOTE — Progress Notes (Signed)
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  with dx of asthma as child with lots of "bronchitis but outgrew by age 47, no trouble with 1st pregnancy  Then around 1999  At wt 235 started noted breathing problems after 2nd pregancy around 2000  p gain 95 lbs >>  Breathing started getting getting worse  and started needing albuterol > allergy eval in [facility] does not remember results Pos but no shots apparently rec referred to pulmonary clinic 05/06/2014 by Jackson Latino with worseing cough and sob refractory to symbicort since late 2014 with pfts nl 12 h p last symbicort  on 07/20/2014      History of Present Illness  05/06/2014 1st [facility] office visit/ Barbara Richardson  ? Recently on ACEi ? Has really stopped?   Chief Complaint  Patient presents with  . Pulmonary Consult    Referred by Dr. Lin Landsman. Pt c/o cough and SOB  since "always"- worse for the past year.  She states that she is SOB "all the time"- with or without any exertion. Her cough is prod with minimal green sputum. She has a rescue inhaler but does not know how often she uses this, instread uses symbicort 3-4 times per day  went from bad to worse x one year daily > noct symptoms of cough and sob at rest and thoroughly confused with maint vs prns >>d/c ACEi  and symbicort rx   05/20/2014 follow-up and medication review Patient returns for a two-week follow-up She was seen last visit for primary consult for asthma with shortness of breath and cough She was recommended to stop her ACE inhibitor Begin Symbicort twice daily We reviewed all her medications organize them into a medication count with patient education Since last visit. Does feel that she has improved Her cough is nearly resolved rec -Remain off of Lisinopril .  -Continue on symbicort 160 Take 2 puffs first thing in am and then another 2 puffs about 12 hours  later.  -Only use your albuterol  as a rescue medication to be used if you can't catch your breath by resting or doing a relaxed purse lip breathing pattern.  - The less you use it, the better it will work when you need it. - Ok to use up to 2 puffs  every 4 hours if you must but call for immediate appointment if use goes up over your usual need - Don't leave home without it !!  (think of it like the spare tire for your car)  -Continue on Zegerd before bfast and supper. -Follow up Dr. Melvyn Novas  In 2 months for PFT  Follow up with family doctor for your blood pressure Avoid ACE inhibitors in future       07/20/2014 f/u ov/Barbara Richardson re: ? Asthma vs pseudoasthma from ACEi, > much better since stopped  Chief Complaint  Patient presents with  . Follow-up    with PFT. Pt states cough and SOB is better since last OV.    Not limited by breathing from desired activities  But relatively sendentary, not using saba at all, did not use symbicort am of ov, no longer consistent with ppi rec Ok to leave off symbicort but if breathing or cough get worse for any reason start it back up  Weight control is simply a matter of calorie balance  05/29/2017  Re-establish ov/Barbara Richardson re: consult re sob/ cough  On symbicort 160 2bid/singulair  Chief Complaint  Patient presents with  . Pulmonary Consult    Referred by Dr. Dorthy Cooler. Pt c/o chest congestion and increased SOB for the past year. She states she gets winded walking up stairs. She is coughing with clear sputum.    noted p tried off symb p last ov and  worse p about a month > symbicort 160 2bid and improved  Uses [facility] parking due to legs and breathing s progression Cough and congestion esp if gets hot  Takes omeprazole 40 mg at hs with overt HB daytime Sleeps on bunch of pillows up about 30 degrees/ o/w cough and choke  Sob assoc with subj wheeze better on pred/ ? Worse p exp to her dog  Overall activity tol gradual downward trend x one year despite wt loss  intentional  rec Plan A = Automatic = Symbicort 160 Take 2 puffs first thing in am and then another 2 puffs about 12 hours later.  Work on Gaffer:  Plan B = Backup Only use your albuterol as a rescue medication   Change omeprazole to Take 30- 60 min before your first and last meals of the day  GERD diet  Please schedule a follow up office visit in 6 weeks   08/11/2017  f/u ov/Barbara Richardson re: asthma with nl pfts p using symbicort and 2 different saba's including neb prior to ov against advice  Chief Complaint  Patient presents with  . Follow-up    PFT's done today. Breathing has been worse since end of Dec 2018.  She has had prod cough with light green sputum.   Dyspnea: MMRC3 = can't walk 100 yards even at a slow pace at a flat grade s stopping due to sob   Cough:  Better p abx  Sleep: 30 degrees HOB Marked over use of saba neb noted  No obvious day to day or daytime variability or assoc excess/ purulent sputum or mucus plugs or hemoptysis or cp or chest tightness, subjective wheeze or overt sinus or hb symptoms. No unusual exposure hx or h/o childhood pna/ asthma or knowledge of premature birth.  Sleeping ok flat without nocturnal  or early am exacerbation  of respiratory  c/o's or need for noct saba. Also denies any obvious fluctuation of symptoms with weather or environmental changes or other aggravating or alleviating factors except as outlined above   Current Allergies, Complete Past Medical History, Past Surgical History, Family History, and Social History were reviewed in [employer] record.  ROS  The following are not active complaints unless bolded Hoarseness, sore throat, dysphagia, dental problems, itching, sneezing,  nasal congestion or discharge of excess mucus or purulent secretions, ear ache,   fever, chills, sweats, unintended wt loss or wt gain, classically pleuritic or exertional cp,  orthopnea pnd or leg swelling, presyncope,  palpitations, abdominal pain, anorexia, nausea, vomiting, diarrhea  or change in bowel habits or change in bladder habits, change in stools or change in urine, dysuria, hematuria,  rash, arthralgias, visual complaints, headache, numbness, weakness or ataxia or problems with walking or coordination,  change in mood/affect or memory.        Current Meds  Medication Sig  . albuterol (PROVENTIL HFA;VENTOLIN HFA) 108 (90 BASE) MCG/ACT inhaler Inhale 2 puffs into the lungs every 6 (six) hours as needed for wheezing.  Marland Kitchen albuterol (PROVENTIL) (2.5 MG/3ML) 0.083% nebulizer solution Take 2.5 mg by  nebulization every 6 (six) hours as needed for wheezing or shortness of breath.  . beta carotene 25000 UNIT capsule Take 25,000 Units by mouth daily.  . budesonide-formoterol (SYMBICORT) 160-4.5 MCG/ACT inhaler Inhale 2 puffs into the lungs 2 (two) times daily.  Marland Kitchen buPROPion (WELLBUTRIN XL) 300 MG 24 hr tablet Take 1 tablet by mouth daily.  . Cyanocobalamin (B-12 PO) Take 1 tablet by mouth daily.  Marland Kitchen docusate sodium (COLACE) 100 MG capsule Take 100 mg by mouth daily.  . Flax OIL Take 1 capsule by mouth daily.  Marland Kitchen FLUoxetine (PROZAC) 20 MG tablet Take 20 mg by mouth daily.  . furosemide (LASIX) 20 MG tablet Take 20 mg by mouth 2 (two) times daily.  Marland Kitchen gabapentin (NEURONTIN) 300 MG capsule Take 300 mg by mouth 3 (three) times daily.  Marland Kitchen losartan (COZAAR) 100 MG tablet Take 1 tablet by mouth daily.  Marland Kitchen LYSINE PO Take 1 capsule by mouth daily.  . metoprolol succinate (TOPROL-XL) 50 MG 24 hr tablet Take 50 mg by mouth daily. Take with or immediately following a meal.  . montelukast (SINGULAIR) 10 MG tablet Take 10 mg by mouth at bedtime.  . Multiple Vitamin (MULTIVITAMIN) capsule Take 1 capsule by mouth daily.  . naproxen (NAPROSYN) 500 MG tablet Take 500 mg by mouth 2 (two) times daily with a meal.  . omeprazole (PRILOSEC) 40 MG capsule Take 40 mg by mouth daily.  . polyethylene glycol (MIRALAX / GLYCOLAX) packet Take  17 g by mouth daily as needed for mild constipation.  . Skin Protectants, Misc. (EUCERIN) cream Apply 1 application topically daily as needed for dry skin.  . SUMAtriptan Succinate (IMITREX PO) Take by mouth.    Marland Kitchen tiZANidine (ZANAFLEX) 4 MG capsule Take 4 mg by mouth 3 (three) times daily.  Marland Kitchen topiramate (TOPAMAX) 100 MG tablet Take 100 mg by mouth 2 (two) times daily.  . traMADol (ULTRAM) 50 MG tablet Take 50 mg by mouth daily. Patient states she is taking this medication everyday            Objective:   Physical Exam   massively obese bf nad   08/11/2017       347  05/29/2017      349   07/20/14 379 lb (171.913 kg)  05/20/14 375 lb 6.4 oz (170.28 kg)  05/06/14 376 lb 6.4 oz (170.734 kg)     Vital signs reviewed - Note on arrival 02 sats  95% on RA     HEENT: nl dentition, turbinates bilaterally, and oropharynx. Nl external ear canals without cough reflex   NECK :  without JVD/Nodes/TM/ nl carotid upstrokes bilaterally   LUNGS: no acc muscle use,  Nl contour chest with prominent upper airway wheeze on insp/exp   CV:  RRR  no s3 or murmur or increase in P2, and no edema   ABD:  soft and nontender with nl inspiratory excursion in the supine position. No bruits or organomegaly appreciated, bowel sounds nl  MS:  Nl gait/ ext warm without deformities, calf tenderness, cyanosis or clubbing No obvious joint restrictions   SKIN: warm and dry without lesions    NEURO:  alert, approp, nl sensorium with  no motor or cerebellar deficits apparent.       .               Assessment & Plan:

## 2017-08-11 NOTE — Progress Notes (Signed)
PFT done today. 

## 2017-08-12 ENCOUNTER — Encounter: Payer: Self-pay | Admitting: Internal Medicine

## 2017-08-12 NOTE — Assessment & Plan Note (Addendum)
05/29/2017  After extensive coaching HFA effectiveness =    90% > continue symb 160 2bid - Allergy profile 05/29/17  >  Eos 0.6 /  IgE  172  RAST pos mild mold only  - FENO 05/29/2017  =   36  -   08/11/2017  After extensive coaching inhaler device  effectiveness =    75% > continue symicort 160  2bid/ singulair  - PFT's  08/11/2017  FEV1 2.04 (77 % ) ratio 73  p 7 % improvement from saba p symb/saba neb prior to study with DLCO  108 % corrects to 128 % for alv volume and min curved  f/v despite active upper airway wheeze on exam    DDX of  difficult airways management almost all start with A and  include Adherence, Ace Inhibitors, Acid Reflux, Active Sinus Disease, Alpha 1 Antitripsin deficiency, Anxiety masquerading as Airways dz,  ABPA,  Allergy(esp in young), Aspiration (esp in elderly), Adverse effects of meds,  Active smokers, A bunch of PE's (a small clot burden can't cause this syndrome unless there is already severe underlying pulm or vascular dz with poor reserve) plus two Bs  = Bronchiectasis and Beta blocker use..and one C= CHF   In this case Adherence is the biggest issue and starts with  inability to use HFA effectively and also  understand that SABA treats the symptoms but doesn't get to the underlying problem (inflammation).  I used  the analogy of putting steroid cream on a rash to help explain the meaning of topical therapy and the need to get the drug to the target tissue.   - see hfa teaching - return with all meds in hand using a trust but verify approach to confirm accurate Medication  Reconciliation The principal here is that until we are certain that the  patients are doing what we've asked, it makes no sense to ask them to do more.   ? Acid (or non-acid) GERD > always difficult to exclude as up to 75% of pts in some series report no assoc GI/ Heartburn symptoms> rec max (24h)  acid suppression and diet restrictions/ reviewed and instructions given in writing.   ? Active sinus  dz > check sinus ct  ? Allergy > continue singulair and high dose symb for now but low threshold to try lower dose if pseudowheeze continues and feno relatively low   ? Anxiety > usually at the bottom of this list of usual suspects but should be much higher on this pt's based on H and P and note already on psychotropics .    I had an extended discussion with the patient reviewing all relevant studies completed to date and  lasting 15 to 20 minutes of a 25 minute visit    Each maintenance medication was reviewed in detail including most importantly the difference between maintenance and prns and under what circumstances the prns are to be triggered using an action plan format that is not reflected in the computer generated alphabetically organized AVS.    Please see AVS for specific instructions unique to this visit that I personally wrote and verbalized to the the pt in detail and then reviewed with pt  by my nurse highlighting any  changes in therapy recommended at today's visit to their plan of care.

## 2017-08-12 NOTE — Assessment & Plan Note (Signed)
Body mass index is 54.35 kg/m.  -  trending down slightly  No results found for: TSH   Contributing to gerd risk/ doe/reviewed the need and the process to achieve and maintain neg calorie balance > defer f/u primary care including intermittently monitoring thyroid status

## 2017-08-22 ENCOUNTER — Ambulatory Visit (INDEPENDENT_AMBULATORY_CARE_PROVIDER_SITE_OTHER)
Admission: RE | Admit: 2017-08-22 | Discharge: 2017-08-22 | Disposition: A | Discharge status: Home / Self Care | Payer: Medicare Other | Source: Ambulatory Visit | Attending: Internal Medicine | Admitting: Internal Medicine

## 2017-08-22 DIAGNOSIS — R059 Cough, unspecified: Secondary | ICD-10-CM

## 2017-08-22 DIAGNOSIS — J45991 Cough variant asthma: Secondary | ICD-10-CM | POA: Diagnosis not present

## 2017-08-22 DIAGNOSIS — R05 Cough: Secondary | ICD-10-CM | POA: Diagnosis not present

## 2017-09-08 ENCOUNTER — Ambulatory Visit: Payer: Medicare Other | Admitting: Internal Medicine

## 2017-09-17 ENCOUNTER — Other Ambulatory Visit: Payer: Self-pay | Admitting: Neurosurgery

## 2017-09-17 DIAGNOSIS — M4802 Spinal stenosis, cervical region: Secondary | ICD-10-CM

## 2017-09-25 ENCOUNTER — Ambulatory Visit (INDEPENDENT_AMBULATORY_CARE_PROVIDER_SITE_OTHER): Payer: Medicare Other | Admitting: Internal Medicine

## 2017-09-25 ENCOUNTER — Encounter: Payer: Self-pay | Admitting: Internal Medicine

## 2017-09-25 VITALS — BP 132/80 | HR 77 | Ht 67.0 in | Wt 350.0 lb

## 2017-09-25 DIAGNOSIS — J45991 Cough variant asthma: Secondary | ICD-10-CM | POA: Diagnosis not present

## 2017-09-25 DIAGNOSIS — R0609 Other forms of dyspnea: Secondary | ICD-10-CM | POA: Diagnosis not present

## 2017-09-25 LAB — NITRIC OXIDE: Nitric Oxide: 33

## 2017-09-25 MED ORDER — BUDESONIDE-FORMOTEROL FUMARATE 80-4.5 MCG/ACT IN AERO: 2.0000 | INHALATION_SPRAY | Freq: Two times a day (BID) | RESPIRATORY_TRACT | 0 refills | Status: DC

## 2017-09-25 MED ORDER — BUDESONIDE-FORMOTEROL FUMARATE 80-4.5 MCG/ACT IN AERO: 2.0000 | INHALATION_SPRAY | Freq: Two times a day (BID) | RESPIRATORY_TRACT | 11 refills | Status: AC

## 2017-09-25 NOTE — Progress Notes (Signed)
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 with lots of "bronchitis but outgrew by age 47, no trouble with 1st pregnancy  Then around 1999  At wt 235 started noted breathing problems after 2nd pregancy around 2000  p gain 95 lbs >>  Breathing started getting getting worse  and started needing albuterol > allergy eval in [facility] does not remember results Pos but no shots apparently rec referred to pulmonary clinic 05/06/2014 by Jackson Latino with worseing cough and sob refractory to symbicort since late 2014 with pfts nl 12 h p last symbicort  on 07/20/2014      History of Present Illness  05/06/2014 1st [facility] visit/ Barbara Richardson  ? Recently on ACEi ? Has really stopped?   Chief Complaint  Patient presents with  . Pulmonary Consult    Referred by Dr. Lin Richardson. Pt c/o cough and SOB  since "always"- worse for the past year.  She states that she is SOB "all the time"- with or without any exertion. Her cough is prod with minimal green sputum. She has a rescue inhaler but does not know how often she uses this, instread uses symbicort 3-4 times per day  went from bad to worse x one year daily > noct symptoms of cough and sob at rest and thoroughly confused with maint vs prns >>d/c ACEi  and symbicort rx   05/20/2014 follow-up and medication review Patient returns for a two-week follow-up She was seen last visit for primary consult for asthma with shortness of breath and cough She was recommended to stop her ACE inhibitor Begin Symbicort twice daily We reviewed all her medications organize them into a medication count with patient education Since last visit. Does feel that she has improved Her cough is nearly resolved rec -Remain off of Lisinopril .  -Continue on symbicort 160 Take 2 puffs first thing in am and then another 2 puffs about 12 hours  later.  -Only use your albuterol  as a rescue medication to be used if you can't catch your breath by resting or doing a relaxed purse lip breathing pattern.  - The less you use it, the better it will work when you need it. - Ok to use up to 2 puffs  every 4 hours if you must but call for immediate appointment if use goes up over your usual need - Don't leave home without it !!  (think of it like the spare tire for your car)  -Continue on Zegerd before bfast and supper. -Follow up Barbara Richardson  In 2 months for PFT  Follow up with family doctor for your blood pressure Avoid ACE inhibitors in future       07/20/2014 f/u ov/Barbara Richardson re: ? Asthma vs pseudoasthma from ACEi, > much better since stopped  Chief Complaint  Patient presents with  . Follow-up    with PFT. Pt states cough and SOB is better since last OV.    Not limited by breathing from desired activities  But relatively sendentary, not using saba at all, did not use symbicort am of ov, no longer consistent with ppi rec Ok to leave off symbicort but if breathing or cough get worse for any reason start it back up  Weight control is simply a matter of calorie balance  05/29/2017  Re-establish ov/Barbara Richardson re: consult re sob/ cough  On symbicort 160 2bid/singulair  Chief Complaint  Patient presents with  . Pulmonary Consult    Referred by Dr. Dorthy Richardson. Pt c/o chest congestion and increased SOB for the past year. She states she gets winded walking up stairs. She is coughing with clear sputum.    noted p tried off symb p last ov and  worse p about a month > symbicort 160 2bid and improved  Uses [facility] parking due to legs and breathing s progression Cough and congestion esp if gets hot  Takes omeprazole 40 mg at hs with overt HB daytime Sleeps on bunch of pillows up about 30 degrees/ o/w cough and choke  Sob assoc with subj wheeze better on pred/ ? Worse p exp to her dog  Overall activity tol gradual downward trend x one year despite wt loss  intentional  rec Plan A = Automatic = Symbicort 160 Take 2 puffs first thing in am and then another 2 puffs about 12 hours later.  Work on Gaffer:  Plan B = Backup Only use your albuterol as a rescue medication   Change omeprazole to Take 30- 60 min before your first and last meals of the day  GERD diet  Please schedule a follow up office visit in 6 weeks   08/11/2017  f/u ov/Barbara Richardson re: asthma with nl pfts p using symbicort and 2 different saba's including neb prior to ov against advice  Chief Complaint  Patient presents with  . Follow-up    PFT's done today. Breathing has been worse since end of Dec 2018.  She has had prod cough with light green sputum.   Dyspnea: MMRC3 = can't walk 100 yards even at a slow pace at a flat grade s stopping due to sob   Cough:  Better p abx  Sleep: 30 degrees HOB Marked over use of saba neb noted rec Plan A = Automatic = Singulair daily and symbicort 160 Take 2 puffs first thing in am and then another 2 puffs about 12 hours later.  Omeprazole is 30- 60 min before your first and last meals of the day   Work on inhaler technique:  Plan B = Backup Only use your albuterol as a rescue medication Plan C = Crisis - only use your albuterol nebulizer if you first try Plan B and it fails to help > ok to use the nebulizer up to every 4 hours but if start needing it regularly call for immediate appointment GERD   schedule sinus ct> single L ethmoid opacification s a/f levels    09/25/2017  f/u ov/Barbara Richardson re: cough variant asthma vs uacs on symb 160 2bid  Chief Complaint  Patient presents with  . Follow-up    Breathing has improved to her normal baseline. She is coughing less.  She has not had to use her albuterol inhaler or neb.    Dyspnea:  MMRC2 = can't walk a nl pace on a flat grade s sob but does fine slow and flat  Cough: better, never noct / non productive/ more sporadic now daytime s obvious trigger Sleep: ok 30 degrees due neck pain    SABA use:  None   No obvious day to day or daytime variability or assoc excess/ purulent sputum or mucus plugs or hemoptysis or cp or chest tightness, subjective wheeze or overt sinus or hb symptoms. No unusual exposure hx or h/o childhood pna  or knowledge of premature  birth.  Sleeping ok 30 degrees  without nocturnal  or early am exacerbation  of respiratory  c/o's or need for noct saba. Also denies any obvious fluctuation of symptoms with weather or environmental changes or other aggravating or alleviating factors except as outlined above   Current Allergies, Complete Past Medical History, Past Surgical History, Family History, and Social History were reviewed in [employer] record.  ROS  The following are not active complaints unless bolded Hoarseness, sore throat, dysphagia, dental problems, itching, sneezing,  nasal congestion or discharge of excess mucus or purulent secretions, ear ache,   fever, chills, sweats, unintended wt loss or wt gain, classically pleuritic or exertional cp,  orthopnea pnd or leg swelling, presyncope, palpitations, abdominal pain, anorexia, nausea, vomiting, diarrhea  or change in bowel habits or change in bladder habits, change in stools or change in urine, dysuria, hematuria,  rash, arthralgias, visual complaints, headache, numbness, weakness or ataxia or problems with walking or coordination,  change in mood/affect or memory.        Current Meds  Medication Sig  . albuterol (PROVENTIL HFA;VENTOLIN HFA) 108 (90 BASE) MCG/ACT inhaler Inhale 2 puffs into the lungs every 6 (six) hours as needed for wheezing.  Marland Kitchen albuterol (PROVENTIL) (2.5 MG/3ML) 0.083% nebulizer solution Take 2.5 mg by nebulization every 6 (six) hours as needed for wheezing or shortness of breath.  Marland Kitchen amLODipine (NORVASC) 5 MG tablet Take 5 mg by mouth daily.  Marland Kitchen aspirin EC 81 MG tablet Take 81 mg by mouth daily.  . beta carotene 25000 UNIT capsule Take 25,000 Units by mouth  daily.  Marland Kitchen buPROPion (WELLBUTRIN XL) 300 MG 24 hr tablet Take 1 tablet by mouth daily.  Marland Kitchen FLUoxetine (PROZAC) 20 MG tablet Take 20 mg by mouth daily.  . furosemide (LASIX) 20 MG tablet Take 20 mg by mouth 2 (two) times daily.  Marland Kitchen gabapentin (NEURONTIN) 300 MG capsule Take 300 mg by mouth 3 (three) times daily.  Marland Kitchen losartan (COZAAR) 100 MG tablet Take 1 tablet by mouth daily.  . metoprolol succinate (TOPROL-XL) 50 MG 24 hr tablet Take 50 mg by mouth daily. Take with or immediately following a meal.  . montelukast (SINGULAIR) 10 MG tablet Take 10 mg by mouth at bedtime.  . naproxen (NAPROSYN) 500 MG tablet Take 500 mg by mouth 2 (two) times daily with a meal.  . omeprazole (PRILOSEC) 40 MG capsule Take 40 mg by mouth daily.  . SUMAtriptan Succinate (IMITREX PO) Take by mouth.    Marland Kitchen tiZANidine (ZANAFLEX) 4 MG capsule Take 4 mg by mouth 3 (three) times daily.  Marland Kitchen topiramate (TOPAMAX) 100 MG tablet Take 100 mg by mouth 2 (two) times daily.  .   budesonide-formoterol (SYMBICORT) 160-4.5 MCG/ACT inhaler Inhale 2 puffs into the lungs 2 (two) times daily.                 Objective:   Physical Exam  amb obese bf nad   09/25/2017       350  08/11/2017       347  05/29/2017      349   07/20/14 379 lb (171.913 kg)  05/20/14 375 lb 6.4 oz (170.28 kg)  05/06/14 376 lb 6.4 oz (170.734 kg)      Vital signs reviewed - Note on arrival 02 sats  100% on RA       HEENT: nl dentition, turbinates bilaterally, and oropharynx. Nl external ear canals without cough reflex   NECK :  without JVD/Nodes/TM/ nl carotid upstrokes bilaterally   LUNGS: no acc muscle use,  Nl contour chest with minimal upper airway "wheeze"    CV:  RRR  no s3 or murmur or increase in P2, and no edema   ABD:  soft and nontender with nl inspiratory excursion in the supine position. No bruits or organomegaly appreciated, bowel sounds nl  MS:  Nl gait/ ext warm without deformities, calf tenderness, cyanosis or clubbing No  obvious joint restrictions   SKIN: warm and dry without lesions    NEURO:  alert, approp, nl sensorium with  no motor or cerebellar deficits apparent.        .               Assessment & Plan:

## 2017-09-25 NOTE — Patient Instructions (Signed)
Try symbicort 80 Take 2 puffs first thing in am and then another 2 puffs about 12 hours later. To see what difference if any it makes in your symptoms or need for albuterol and if none then continue it, if worse then resume the 160 strength   Please schedule a follow up visit in 3 months but call sooner if needed

## 2017-09-26 IMAGING — DX DG CHEST 2V
2 series · 2 of 2 positions shown · non-contrast
Comparison: PA and lateral chest 02/01/2016 and 05/08/2014.

CLINICAL DATA: Acute onset dizziness, lightheadedness and
diaphoresis today.

EXAM:
CHEST  2 VIEW

[w chest pa]
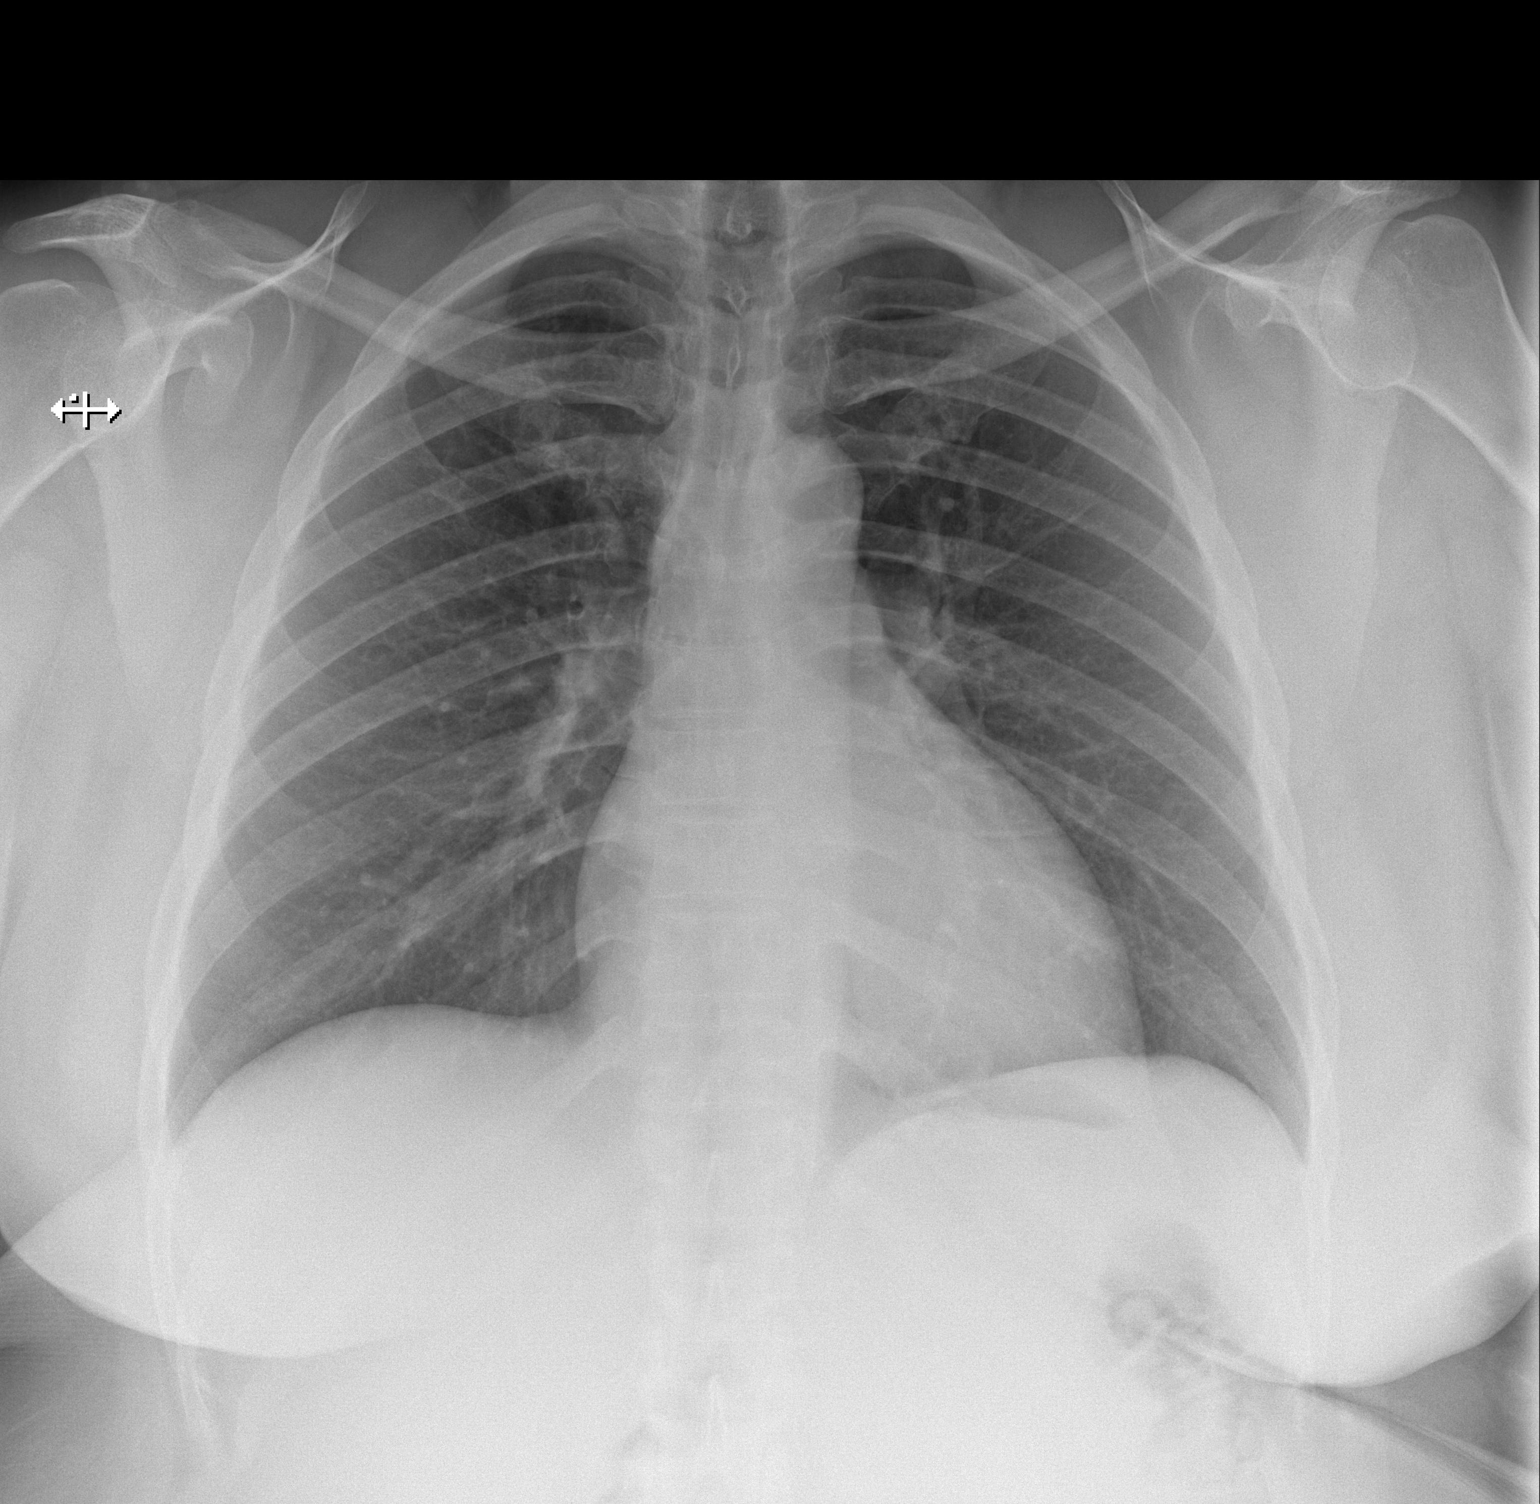

[w chest lat]
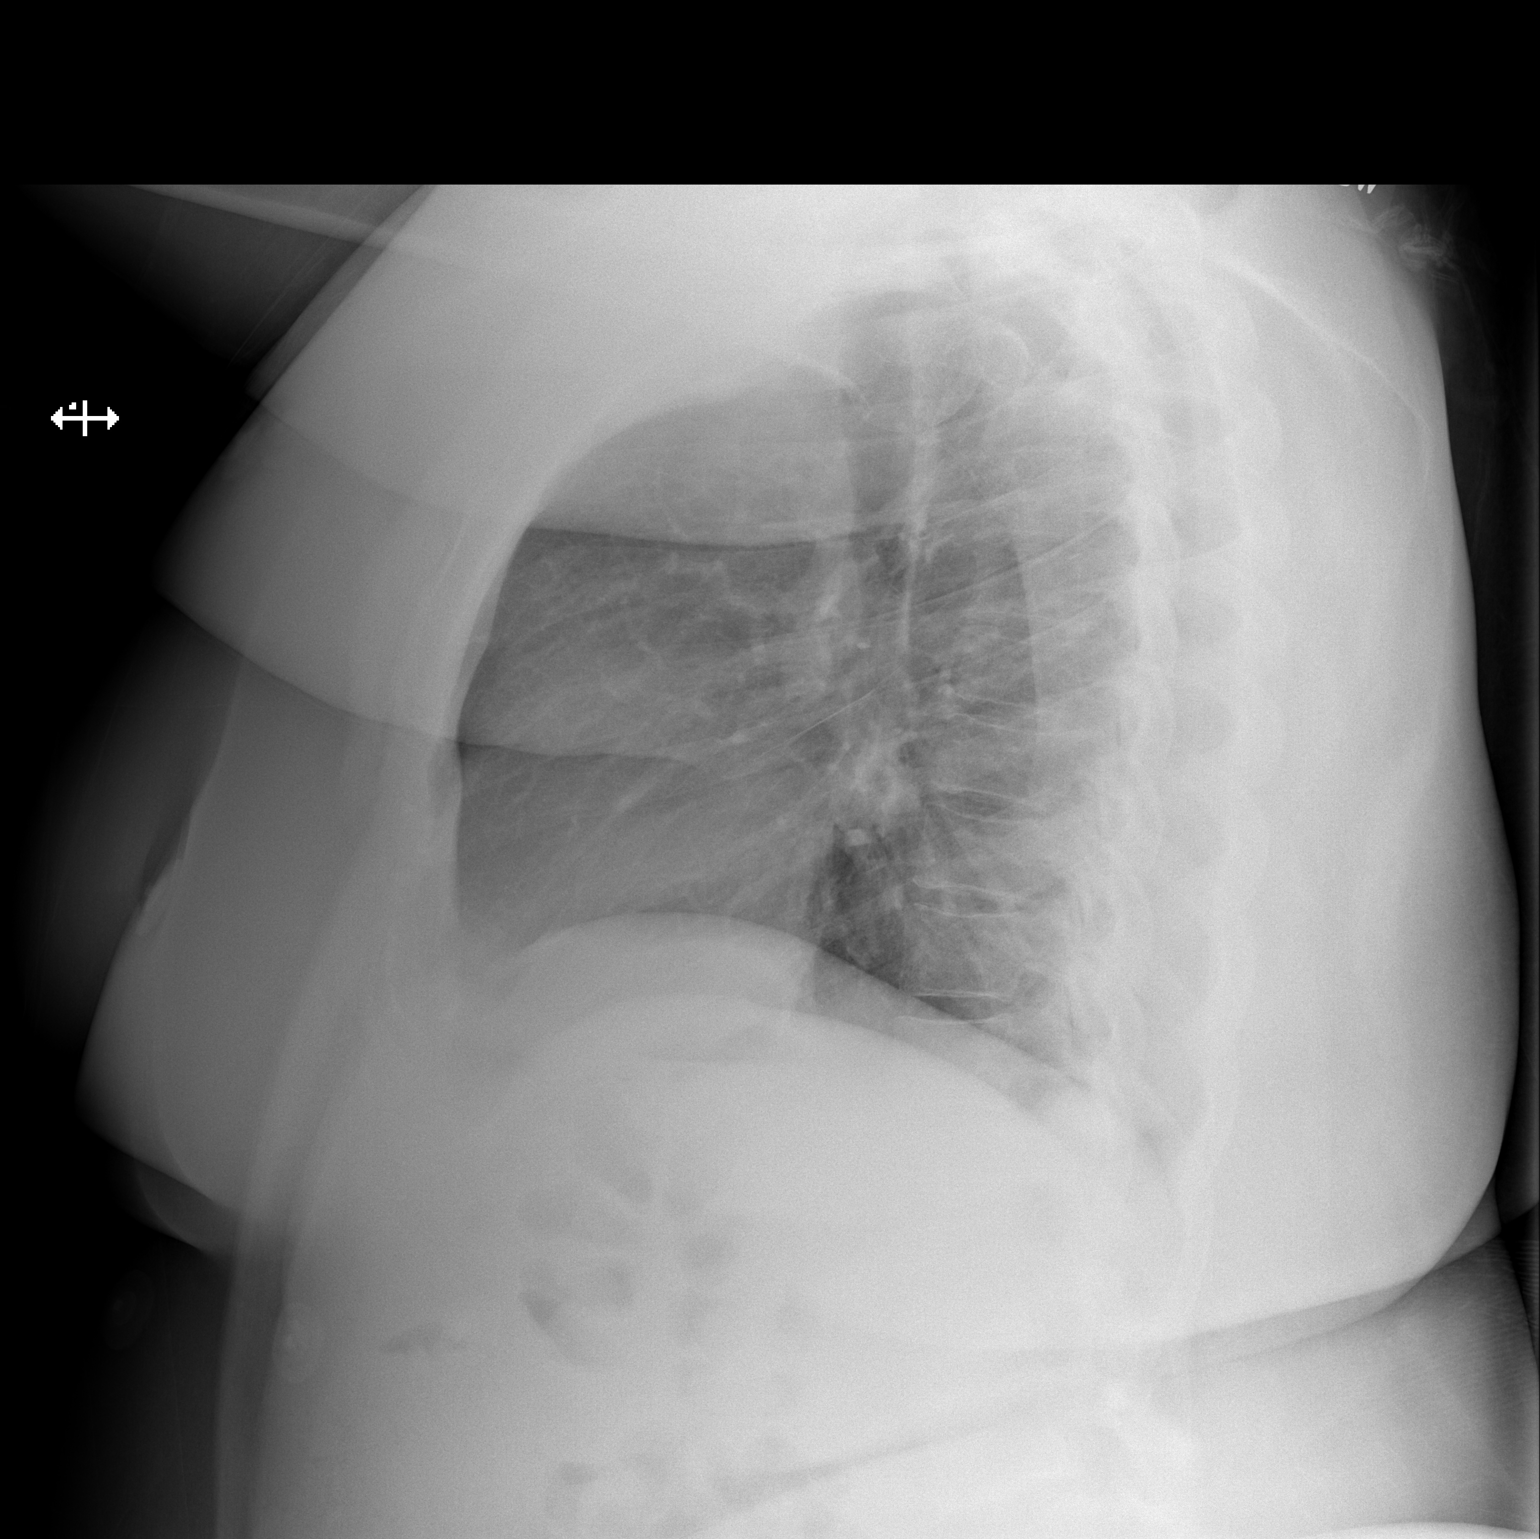

[2 of 2 positions shown; findings below may reference images not displayed]

FINDINGS: The lungs are clear. Heart size is normal. No pneumothorax or
pleural effusion. No focal bony abnormality.
IMPRESSION: Negative chest.

## 2017-09-27 ENCOUNTER — Ambulatory Visit
Admission: RE | Admit: 2017-09-27 | Discharge: 2017-09-27 | Disposition: A | Discharge status: Home / Self Care | Payer: Medicare Other | Source: Ambulatory Visit | Attending: Neurosurgery | Admitting: Neurosurgery

## 2017-09-27 DIAGNOSIS — M4802 Spinal stenosis, cervical region: Secondary | ICD-10-CM

## 2017-09-28 ENCOUNTER — Encounter: Payer: Self-pay | Admitting: Internal Medicine

## 2017-09-28 NOTE — Assessment & Plan Note (Signed)
05/29/2017  After extensive coaching HFA effectiveness =    90% > continue symb 160 2bid - Allergy profile 05/29/17  >  Eos 0.6 /  IgE  172  RAST pos mild mold only  - FENO 05/29/2017  =   36 -   08/11/2017  After extensive coaching inhaler device  effectiveness =    75% > continue symicort 160  2bid/ singulair  - PFT's  08/11/2017  FEV1 2.04 (77 % ) ratio 73  p 7 % improvement from saba p symb/saba neb prior to study with DLCO  108 % corrects to 128 % for alv volume and min curved  f/v despite active upper airway wheeze on exam   - Sinus CT 08/22/2017  Single ethmoid opacification   - FENO 09/25/2017  =   33 on symb 160 with pseudowheeze > try 80 2bid    Continues to be difficult to sort out her upper airway symptoms from the lower but it could well prove to be the case that less is more is she has uacs and asthma as the higher doses of ics may actually contribute to upper airway irritability  09/25/2017  After extensive coaching inhaler device  effectiveness =    90%    I had an extended discussion with the patient reviewing all relevant studies completed to date and  lasting 15 to 20 minutes of a 25 minute visit    Each maintenance medication was reviewed in detail including most importantly the difference between maintenance and prns and under what circumstances the prns are to be triggered using an action plan format that is not reflected in the computer generated alphabetically organized AVS.    Please see AVS for specific instructions unique to this visit that I personally wrote and verbalized to the the pt in detail and then reviewed with pt  by my nurse highlighting any  changes in therapy recommended at today's visit to their plan of care.

## 2017-09-28 NOTE — Assessment & Plan Note (Signed)
Body mass index is 54.82 kg/m.  -  trending  Up again slightly  No results found for: TSH   Contributing to gerd risk/ doe/reviewed the need and the process to achieve and maintain neg calorie balance > defer f/u primary care including intermittently monitoring thyroid status

## 2017-09-28 NOTE — Assessment & Plan Note (Signed)
-   trial off all acei 05/06/2014  > improved 07/20/14 with nl pfts x low erv > try off symbicort  - 05/29/2017  Walked RA x 3 laps @ 185 ft each stopped due to  End of study, fast pace, leg pain and sob and end with sats down to 90%      Most likely due to obesity/ deconditoning and overall better> no change rx

## 2017-12-26 ENCOUNTER — Ambulatory Visit: Admit: 2017-12-26 | Payer: Medicare Other | Admitting: Neurosurgery

## 2017-12-26 ENCOUNTER — Ambulatory Visit: Payer: Medicare Other | Admitting: Internal Medicine

## 2017-12-26 SURGERY — ANTERIOR CERVICAL DECOMPRESSION/DISCECTOMY FUSION 2 LEVELS
Anesthesia: General

## 2018-01-02 ENCOUNTER — Other Ambulatory Visit: Payer: Self-pay | Admitting: Neurosurgery

## 2018-01-05 MED ORDER — DEXTROSE 5 % IV SOLN
3.0000 g | INTRAVENOUS | Status: AC
  Filled 2018-01-05: qty 3000

## 2018-01-05 NOTE — Progress Notes (Signed)
Have attempted to call pt several times and each time there is a recording that states "unable to complete call at this time, try again later". I called pt's mother and left her a message, she returned my call and stated that she was not aware pt was having surgery and pt's phone is "off". I asked her if she had another number for pt or if she would be seeing her. She states she will try to get hold of pt. Mrs. Mullet (pt's mother) called me back later and said she thinks the surgery needs to be cancelled because she cannot get hold of pt and she states she just saw pt yesterday and pt did not mention anything to her about having surgery. I told her that I would call Dr. Cleotilde Neer office and let them know. I spoke to Dominica at Dr. Cleotilde Neer office and she states she will try pt and if she doesn't get hold of her, she will talk with Dr. Kathyrn Sheriff.

## 2018-01-22 ENCOUNTER — Encounter (HOSPITAL_COMMUNITY): Payer: Self-pay | Admitting: *Deleted

## 2018-01-22 ENCOUNTER — Other Ambulatory Visit: Payer: Self-pay | Admitting: Neurosurgery

## 2018-01-22 ENCOUNTER — Other Ambulatory Visit: Payer: Self-pay

## 2018-01-22 DIAGNOSIS — F419 Anxiety disorder, unspecified: Secondary | ICD-10-CM | POA: Diagnosis not present

## 2018-01-22 DIAGNOSIS — Z91018 Allergy to other foods: Secondary | ICD-10-CM | POA: Diagnosis not present

## 2018-01-22 DIAGNOSIS — G4733 Obstructive sleep apnea (adult) (pediatric): Secondary | ICD-10-CM | POA: Diagnosis not present

## 2018-01-22 DIAGNOSIS — Z79891 Long term (current) use of opiate analgesic: Secondary | ICD-10-CM | POA: Diagnosis not present

## 2018-01-22 DIAGNOSIS — J45991 Cough variant asthma: Secondary | ICD-10-CM | POA: Diagnosis not present

## 2018-01-22 DIAGNOSIS — Z7951 Long term (current) use of inhaled steroids: Secondary | ICD-10-CM | POA: Diagnosis not present

## 2018-01-22 DIAGNOSIS — Z8249 Family history of ischemic heart disease and other diseases of the circulatory system: Secondary | ICD-10-CM | POA: Diagnosis not present

## 2018-01-22 DIAGNOSIS — Z79899 Other long term (current) drug therapy: Secondary | ICD-10-CM | POA: Diagnosis not present

## 2018-01-22 DIAGNOSIS — I10 Essential (primary) hypertension: Secondary | ICD-10-CM | POA: Diagnosis not present

## 2018-01-22 DIAGNOSIS — F319 Bipolar disorder, unspecified: Secondary | ICD-10-CM | POA: Diagnosis not present

## 2018-01-22 DIAGNOSIS — M4802 Spinal stenosis, cervical region: Secondary | ICD-10-CM | POA: Diagnosis not present

## 2018-01-22 DIAGNOSIS — Z6841 Body Mass Index (BMI) 40.0 and over, adult: Secondary | ICD-10-CM | POA: Diagnosis not present

## 2018-01-22 DIAGNOSIS — Z7982 Long term (current) use of aspirin: Secondary | ICD-10-CM | POA: Diagnosis not present

## 2018-01-22 DIAGNOSIS — K219 Gastro-esophageal reflux disease without esophagitis: Secondary | ICD-10-CM | POA: Diagnosis not present

## 2018-01-22 DIAGNOSIS — Z87891 Personal history of nicotine dependence: Secondary | ICD-10-CM | POA: Diagnosis not present

## 2018-01-22 DIAGNOSIS — M5011 Cervical disc disorder with radiculopathy,  high cervical region: Secondary | ICD-10-CM | POA: Diagnosis present

## 2018-01-22 NOTE — Anesthesia Preprocedure Evaluation (Addendum)
Anesthesia Evaluation  Patient identified by MRN, date of birth, ID band Patient awake    Reviewed: Allergy & Precautions, H&P , NPO status , Patient's Chart, lab work & pertinent test results, reviewed documented beta blocker date and time   Airway Mallampati: II  TM Distance: >3 FB Neck ROM: full    Dental no notable dental hx.    Pulmonary asthma , sleep apnea ,    Pulmonary exam normal breath sounds clear to auscultation       Cardiovascular Exercise Tolerance: Good hypertension, Pt. on medications and Pt. on home beta blockers + dysrhythmias Atrial Fibrillation  Rhythm:regular Rate:Normal     Neuro/Psych  Headaches, PSYCHIATRIC DISORDERS Anxiety Bipolar Disorder    GI/Hepatic GERD  Medicated,  Endo/Other  negative endocrine ROSMorbid obesity  Renal/GU      Musculoskeletal  (+) Arthritis ,   Abdominal   Peds  Hematology   Anesthesia Other Findings   Reproductive/Obstetrics                             Anesthesia Physical Anesthesia Plan  ASA: III  Anesthesia Plan: General   Post-op Pain Management:    Induction: Intravenous  PONV Risk Score and Plan: 3 and Treatment may vary due to age or medical condition, Dexamethasone and Ondansetron  Airway Management Planned: Oral ETT and Video Laryngoscope Planned  Additional Equipment:   Intra-op Plan:   Post-operative Plan: Extubation in OR  Informed Consent: I have reviewed the patients History and Physical, chart, labs and discussed the procedure including the risks, benefits and alternatives for the proposed anesthesia with the patient or authorized representative who has indicated his/her understanding and acceptance.   Dental Advisory Given  Plan Discussed with: CRNA, Surgeon and Anesthesiologist  Anesthesia Plan Comments: (  )       Anesthesia Quick Evaluation

## 2018-01-22 NOTE — H&P (Signed)
Chief Complaint   Neck pain  HPI   HPI: Barbara Richardson is a 47 y.o. female with relatively severe neck and bilateral shoulder and arm pain. 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  There is also central spot C3-4 disc protrusion with primarily right-sided foraminal stenosis.  At C6-7 there is also right eccentric disc protrusion with resultant right foraminal stenosis.  She has failed conservative treatment.She presents today for surgery.  She is without concerns today.   Patient Active Problem List   Diagnosis Date Noted  . Morbid obesity due to excess calories ([place]) 05/31/2017  . OSA (obstructive sleep apnea) 08/31/2014  . Inadequate sleep hygiene 08/31/2014  . Excessive daytime sleepiness 05/23/2014  . Dyspnea on exertion 05/06/2014  . Essential hypertension 10/18/2011  . Cough variant asthma 10/18/2011  . GERD (gastroesophageal reflux disease) 10/18/2011    PMH: Past Medical History:  Diagnosis Date  . Abnormal Pap smear   . Anxiety   . Asthma   . Atrial fibrillation ([place])   . Bipolar disorder ([place])   . BV (bacterial vaginosis) 08/24/2010  . DDD (degenerative disc disease), cervical   . DJD (degenerative joint disease)   . DUB (dysfunctional uterine bleeding) 02/08/2010  . Dyspnea   . Endometrial polyp 03/2010  . GERD (gastroesophageal reflux disease)   . Hx of chlamydia infection 1995  . Hx of constipation 10/17/2010  . Hx of menorrhagia 03/2010  . Hypertension   . Migraines   . Obesity   . Obesity, morbid (more than 100 lbs over ideal weight or BMI > 40) ([place]) 2011  . Uterine fibroid 03/2010  . Yeast infection     PSH: Past Surgical History:  Procedure Laterality Date  . BREAST REDUCTION SURGERY  1999  . CARPAL TUNNEL RELEASE    . CESAREAN SECTION    . HYSTEROSCOPY W/ ENDOMETRIAL ABLATION  2011  . KNEE SURGERY Left    fracture   . TONSILLECTOMY  2010  . TUBAL LIGATION  1999    Medications Prior to Admission  Medication Sig Dispense Refill Last Dose  . amLODipine (NORVASC) 5 MG tablet Take 5 mg by mouth daily.   01/23/2018 at 0430  . aspirin EC 81 MG tablet Take 81 mg by mouth daily.   Past Week at Unknown time  . budesonide-formoterol (SYMBICORT) 160-4.5 MCG/ACT inhaler Inhale 2 puffs into the lungs 2 (two) times daily.   01/22/2018 at Unknown time  . buPROPion (WELLBUTRIN XL) 300 MG 24 hr tablet Take 300 mg by mouth daily.   3 01/23/2018 at 0430  . docusate sodium (COLACE) 100 MG capsule Take 100 mg by mouth daily.   01/22/2018 at Unknown time  . FLUoxetine (PROZAC) 20 MG tablet Take 20 mg by mouth daily.   01/23/2018 at 0430  . furosemide (LASIX) 20 MG tablet Take 20 mg by mouth 2 (two) times daily.   01/22/2018 at Unknown time  . gabapentin (NEURONTIN) 300 MG capsule Take 300 mg by mouth 2 (two) times daily.    01/23/2018 at 0430  . losartan (COZAAR) 100 MG tablet Take 100 mg by mouth daily.   3 01/22/2018 at Unknown time  . metoprolol succinate (TOPROL-XL) 25 MG 24 hr tablet Take 25 mg by mouth daily.    01/23/2018 at 0430  . montelukast (SINGULAIR) 10 MG tablet Take 10 mg by mouth at bedtime.   01/22/2018  at Unknown time  . naproxen (NAPROSYN) 500 MG tablet Take 500 mg by mouth 2 (two) times daily with a meal.   01/22/2018 at Unknown time  . omeprazole (PRILOSEC) 40 MG capsule Take 40 mg by mouth daily.   01/23/2018 at 0430  . tiZANidine (ZANAFLEX) 4 MG capsule Take 4 mg by mouth 3 (three) times daily.   01/22/2018 at Unknown time  . topiramate (TOPAMAX) 100 MG tablet Take 100 mg by mouth 2 (two) times daily.   01/23/2018 at 0430  . traMADol (ULTRAM) 50 MG tablet Take 50 mg by mouth 2 (two) times daily.   01/23/2018 at 0430  . albuterol (PROVENTIL HFA;VENTOLIN HFA) 108 (90 BASE) MCG/ACT inhaler Inhale 2 puffs into the lungs every 6 (six) hours as needed for wheezing. 1 Inhaler 2 More than a month at Unknown time  . albuterol  (PROVENTIL) (2.5 MG/3ML) 0.083% nebulizer solution Take 2.5 mg by nebulization every 6 (six) hours as needed for wheezing or shortness of breath.   More than a month at Unknown time  . budesonide-formoterol (SYMBICORT) 80-4.5 MCG/ACT inhaler Inhale 2 puffs into the lungs 2 (two) times daily. (Patient not taking: Reported on 01/02/2018) 1 Inhaler 11 Not Taking at Unknown time  . SUMAtriptan (IMITREX) 100 MG tablet Take 100 mg by mouth daily as needed (migraine).    More than a month at Unknown time    SH: Social History   Tobacco Use  . Smoking status: Never Smoker  . Smokeless tobacco: Former Network engineer Use Topics  . Alcohol use: No    Alcohol/week: 0.0 oz  . Drug use: No    MEDS: Prior to Admission medications   Medication Sig Start Date End Date Taking? Authorizing Provider  amLODipine (NORVASC) 5 MG tablet Take 5 mg by mouth daily.   Yes [provider]  aspirin EC 81 MG tablet Take 81 mg by mouth daily.   Yes [provider]  budesonide-formoterol (SYMBICORT) 160-4.5 MCG/ACT inhaler Inhale 2 puffs into the lungs 2 (two) times daily.   Yes [provider]  buPROPion (WELLBUTRIN XL) 300 MG 24 hr tablet Take 300 mg by mouth daily.  05/10/14  Yes [provider]  docusate sodium (COLACE) 100 MG capsule Take 100 mg by mouth daily.   Yes [provider]  FLUoxetine (PROZAC) 20 MG tablet Take 20 mg by mouth daily.   Yes [provider]  furosemide (LASIX) 20 MG tablet Take 20 mg by mouth 2 (two) times daily.   Yes [provider]  gabapentin (NEURONTIN) 300 MG capsule Take 300 mg by mouth 2 (two) times daily.    Yes [provider]  losartan (COZAAR) 100 MG tablet Take 100 mg by mouth daily.  03/16/14  Yes [provider]  metoprolol succinate (TOPROL-XL) 25 MG 24 hr tablet Take 25 mg by mouth daily.    Yes [provider]  montelukast (SINGULAIR) 10 MG tablet Take 10 mg by mouth at bedtime.   Yes  [provider]  naproxen (NAPROSYN) 500 MG tablet Take 500 mg by mouth 2 (two) times daily with a meal.   Yes [provider]  omeprazole (PRILOSEC) 40 MG capsule Take 40 mg by mouth daily.   Yes [provider]  tiZANidine (ZANAFLEX) 4 MG capsule Take 4 mg by mouth 3 (three) times daily.   Yes [provider]  topiramate (TOPAMAX) 100 MG tablet Take 100 mg by mouth 2 (two) times daily.  Yes [provider]  traMADol (ULTRAM) 50 MG tablet Take 50 mg by mouth 2 (two) times daily.   Yes [provider]  albuterol (PROVENTIL HFA;VENTOLIN HFA) 108 (90 BASE) MCG/ACT inhaler Inhale 2 puffs into the lungs every 6 (six) hours as needed for wheezing. 04/05/13   Gregor Hams, MD  albuterol (PROVENTIL) (2.5 MG/3ML) 0.083% nebulizer solution Take 2.5 mg by nebulization every 6 (six) hours as needed for wheezing or shortness of breath.    [provider]  budesonide-formoterol (SYMBICORT) 80-4.5 MCG/ACT inhaler Inhale 2 puffs into the lungs 2 (two) times daily. Patient not taking: Reported on 01/02/2018 09/25/17   Tanda Rockers, MD  SUMAtriptan (IMITREX) 100 MG tablet Take 100 mg by mouth daily as needed (migraine).     [provider]    ALLERGY: Allergies  Allergen Reactions  . Other Hives    Peaches    Social History   Tobacco Use  . Smoking status: Never Smoker  . Smokeless tobacco: Former Network engineer Use Topics  . Alcohol use: No    Alcohol/week: 0.0 oz     Family History  Problem Relation Age of Onset  . Diabetes Father   . Cancer Maternal Grandfather   . Heart disease Maternal Grandmother      ROS   ROS  Exam   There were no vitals filed for this visit. General appearance: WDWN, NAD Eyes: PERRL, Fundoscopic: normal Cardiovascular: Regular rate and rhythm without murmurs, rubs, gallops. No edema or variciosities. Distal pulses normal. Pulmonary: Clear to auscultation Musculoskeletal:     Muscle tone  upper extremities: Normal    Muscle tone lower extremities: Normal    Motor exam: Upper Extremities Deltoid Bicep Tricep Grip  Right 5/5 5/5 5/5 5/5  Left 5/5 5/5 5/5 5/5   Lower Extremity IP Quad PF DF EHL  Right 5/5 5/5 5/5 5/5 5/5  Left 5/5 5/5 5/5 5/5 5/5   Neurological Awake, alert, oriented Memory and concentration grossly intact Speech fluent, appropriate CNII: Visual fields normal CNIII/IV/VI: EOMI CNV: Facial sensation normal CNVII: Symmetric, normal strength CNVIII: Grossly normal CNIX: Normal palate movement CNXI: Trap and SCM strength normal CN XII: Tongue protrusion normal Sensation grossly intact to LT DTR: Normal Coordination (finger/nose & heel/shin): Normal  Results - Imaging/Labs   No results found for this or any previous visit (from the past 48 hour(s)).  No results found. Impression/Plan   47 y.o. 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 we will plan on proceeding with ACDF at C3-4 and C4-5  While in the office, the risks, benefits and alternatives to surgery were discussed in detail.  Patient states  In own language understanding procedure and wishes to proceed.  Consent has been signed.

## 2018-01-23 ENCOUNTER — Inpatient Hospital Stay (HOSPITAL_COMMUNITY)
Admission: AD | Disposition: A | Discharge status: Home / Self Care | Payer: Self-pay | Source: Ambulatory Visit | Attending: Neurosurgery

## 2018-01-23 ENCOUNTER — Ambulatory Visit (HOSPITAL_COMMUNITY): Payer: Medicare Other | Admitting: Physician Assistant

## 2018-01-23 ENCOUNTER — Inpatient Hospital Stay (HOSPITAL_COMMUNITY)
Admission: AD | Admit: 2018-01-23 | Discharge: 2018-01-24 | DRG: 472 | Disposition: A | Discharge status: Home / Self Care | Payer: Medicare Other | Source: Ambulatory Visit | Attending: Neurosurgery | Admitting: Neurosurgery

## 2018-01-23 ENCOUNTER — Inpatient Hospital Stay (HOSPITAL_COMMUNITY): Payer: Medicare Other

## 2018-01-23 ENCOUNTER — Encounter (HOSPITAL_COMMUNITY): Payer: Self-pay

## 2018-01-23 DIAGNOSIS — K219 Gastro-esophageal reflux disease without esophagitis: Secondary | ICD-10-CM | POA: Diagnosis not present

## 2018-01-23 DIAGNOSIS — I10 Essential (primary) hypertension: Secondary | ICD-10-CM | POA: Diagnosis present

## 2018-01-23 DIAGNOSIS — M5011 Cervical disc disorder with radiculopathy,  high cervical region: Principal | ICD-10-CM | POA: Diagnosis present

## 2018-01-23 DIAGNOSIS — F319 Bipolar disorder, unspecified: Secondary | ICD-10-CM | POA: Diagnosis present

## 2018-01-23 DIAGNOSIS — Z91018 Allergy to other foods: Secondary | ICD-10-CM | POA: Diagnosis not present

## 2018-01-23 DIAGNOSIS — Z79891 Long term (current) use of opiate analgesic: Secondary | ICD-10-CM | POA: Diagnosis not present

## 2018-01-23 DIAGNOSIS — G4733 Obstructive sleep apnea (adult) (pediatric): Secondary | ICD-10-CM | POA: Diagnosis not present

## 2018-01-23 DIAGNOSIS — J45991 Cough variant asthma: Secondary | ICD-10-CM | POA: Diagnosis not present

## 2018-01-23 DIAGNOSIS — F419 Anxiety disorder, unspecified: Secondary | ICD-10-CM | POA: Diagnosis not present

## 2018-01-23 DIAGNOSIS — Z79899 Other long term (current) drug therapy: Secondary | ICD-10-CM | POA: Diagnosis not present

## 2018-01-23 DIAGNOSIS — Z7982 Long term (current) use of aspirin: Secondary | ICD-10-CM

## 2018-01-23 DIAGNOSIS — Z419 Encounter for procedure for purposes other than remedying health state, unspecified: Secondary | ICD-10-CM

## 2018-01-23 DIAGNOSIS — Z6841 Body Mass Index (BMI) 40.0 and over, adult: Secondary | ICD-10-CM

## 2018-01-23 DIAGNOSIS — Z87891 Personal history of nicotine dependence: Secondary | ICD-10-CM | POA: Diagnosis not present

## 2018-01-23 DIAGNOSIS — Z8249 Family history of ischemic heart disease and other diseases of the circulatory system: Secondary | ICD-10-CM | POA: Diagnosis not present

## 2018-01-23 DIAGNOSIS — M4802 Spinal stenosis, cervical region: Secondary | ICD-10-CM | POA: Diagnosis not present

## 2018-01-23 DIAGNOSIS — M5412 Radiculopathy, cervical region: Secondary | ICD-10-CM

## 2018-01-23 DIAGNOSIS — Z7951 Long term (current) use of inhaled steroids: Secondary | ICD-10-CM

## 2018-01-23 HISTORY — DX: Bipolar disorder, unspecified: F31.9

## 2018-01-23 HISTORY — DX: Unspecified osteoarthritis, unspecified site: M19.90

## 2018-01-23 HISTORY — DX: Anxiety disorder, unspecified: F41.9

## 2018-01-23 HISTORY — DX: Dyspnea, unspecified: R06.00

## 2018-01-23 HISTORY — DX: Radiculopathy, cervical region: M54.12

## 2018-01-23 HISTORY — PX: ANTERIOR CERVICAL DECOMP/DISCECTOMY FUSION: SHX1161

## 2018-01-23 HISTORY — DX: Other cervical disc degeneration, unspecified cervical region: M50.30

## 2018-01-23 LAB — BASIC METABOLIC PANEL
Anion gap: 10 (ref 5–15)
BUN: 18 mg/dL (ref 6–20)
CHLORIDE: 108 mmol/L (ref 98–111)
CO2: 21 mmol/L — AB (ref 22–32)
CREATININE: 1.15 mg/dL — AB (ref 0.44–1.00)
Calcium: 8.8 mg/dL — ABNORMAL LOW (ref 8.9–10.3)
GFR calc non Af Amer: 55 mL/min — ABNORMAL LOW (ref >60)
Glucose, Bld: 74 mg/dL (ref 70–99)
Potassium: 4.7 mmol/L (ref 3.5–5.1)
Sodium: 139 mmol/L (ref 135–145)

## 2018-01-23 LAB — CBC
HEMATOCRIT: 44.4 % (ref 36.0–46.0)
Hemoglobin: 14 g/dL (ref 12.0–15.0)
MCH: 29.5 pg (ref 26.0–34.0)
MCHC: 31.5 g/dL (ref 30.0–36.0)
MCV: 93.7 fL (ref 78.0–100.0)
PLATELETS: 275 10*3/uL (ref 150–400)
RBC: 4.74 MIL/uL (ref 3.87–5.11)
RDW: 13 % (ref 11.5–15.5)
WBC: 6 10*3/uL (ref 4.0–10.5)

## 2018-01-23 LAB — POCT PREGNANCY, URINE: Preg Test, Ur: NEGATIVE

## 2018-01-23 LAB — SURGICAL PCR SCREEN
MRSA, PCR: NEGATIVE
STAPHYLOCOCCUS AUREUS: NEGATIVE

## 2018-01-23 LAB — TYPE AND SCREEN
ABO/RH(D): O POS
Antibody Screen: NEGATIVE

## 2018-01-23 LAB — ABO/RH: ABO/RH(D): O POS

## 2018-01-23 SURGERY — ANTERIOR CERVICAL DECOMPRESSION/DISCECTOMY FUSION 2 LEVELS
Anesthesia: General | Site: Spine Cervical

## 2018-01-23 MED ORDER — CEFAZOLIN SODIUM-DEXTROSE 2-4 GM/100ML-% IV SOLN
INTRAVENOUS | Status: AC
  Filled 2018-01-23: qty 100

## 2018-01-23 MED ORDER — PROPOFOL 10 MG/ML IV BOLUS
INTRAVENOUS | Status: AC
  Filled 2018-01-23: qty 20

## 2018-01-23 MED ORDER — METHOCARBAMOL 500 MG PO TABS
500.0000 mg | ORAL_TABLET | Freq: Four times a day (QID) | ORAL | Status: DC | PRN
  Administered 2018-01-23 – 2018-01-24 (×2): 500 mg via ORAL
  Filled 2018-01-23 (×2): qty 1

## 2018-01-23 MED ORDER — ALBUTEROL SULFATE HFA 108 (90 BASE) MCG/ACT IN AERS
INHALATION_SPRAY | RESPIRATORY_TRACT | Status: DC | PRN
  Administered 2018-01-23: 8 via RESPIRATORY_TRACT

## 2018-01-23 MED ORDER — OXYCODONE HCL 5 MG PO TABS
ORAL_TABLET | ORAL | Status: AC
  Filled 2018-01-23: qty 1

## 2018-01-23 MED ORDER — HYDROMORPHONE HCL 1 MG/ML IJ SOLN: 0.5000 mg | INTRAMUSCULAR | Status: DC | PRN

## 2018-01-23 MED ORDER — ONDANSETRON HCL 4 MG/2ML IJ SOLN
INTRAMUSCULAR | Status: DC | PRN
  Administered 2018-01-23: 4 mg via INTRAVENOUS

## 2018-01-23 MED ORDER — MIDAZOLAM HCL 5 MG/5ML IJ SOLN
INTRAMUSCULAR | Status: DC | PRN
  Administered 2018-01-23: 2 mg via INTRAVENOUS

## 2018-01-23 MED ORDER — BUPIVACAINE HCL (PF) 0.5 % IJ SOLN
INTRAMUSCULAR | Status: AC
  Filled 2018-01-23: qty 30

## 2018-01-23 MED ORDER — DEXAMETHASONE SODIUM PHOSPHATE 4 MG/ML IJ SOLN
INTRAMUSCULAR | Status: DC | PRN
  Administered 2018-01-23: 10 mg via INTRAVENOUS

## 2018-01-23 MED ORDER — SODIUM CHLORIDE 0.9 % IV SOLN
INTRAVENOUS | Status: DC | PRN
  Administered 2018-01-23: 40 ug/min via INTRAVENOUS

## 2018-01-23 MED ORDER — CEFAZOLIN SODIUM-DEXTROSE 2-4 GM/100ML-% IV SOLN
2.0000 g | Freq: Three times a day (TID) | INTRAVENOUS | Status: AC
  Administered 2018-01-23 (×2): 2 g via INTRAVENOUS
  Filled 2018-01-23 (×2): qty 100

## 2018-01-23 MED ORDER — FENTANYL CITRATE (PF) 250 MCG/5ML IJ SOLN
INTRAMUSCULAR | Status: AC
  Filled 2018-01-23: qty 5

## 2018-01-23 MED ORDER — OXYCODONE HCL 5 MG PO TABS
5.0000 mg | ORAL_TABLET | ORAL | Status: DC | PRN
  Administered 2018-01-23 – 2018-01-24 (×4): 10 mg via ORAL
  Filled 2018-01-23 (×4): qty 2

## 2018-01-23 MED ORDER — ONDANSETRON HCL 4 MG/2ML IJ SOLN: 4.0000 mg | Freq: Four times a day (QID) | INTRAMUSCULAR | Status: DC | PRN

## 2018-01-23 MED ORDER — ACETAMINOPHEN 160 MG/5ML PO SOLN: 325.0000 mg | ORAL | Status: DC | PRN

## 2018-01-23 MED ORDER — SUGAMMADEX SODIUM 200 MG/2ML IV SOLN
INTRAVENOUS | Status: AC
  Filled 2018-01-23: qty 2

## 2018-01-23 MED ORDER — GABAPENTIN 300 MG PO CAPS
300.0000 mg | ORAL_CAPSULE | Freq: Three times a day (TID) | ORAL | Status: DC
  Administered 2018-01-23 – 2018-01-24 (×3): 300 mg via ORAL
  Filled 2018-01-23 (×3): qty 1

## 2018-01-23 MED ORDER — ROCURONIUM BROMIDE 100 MG/10ML IV SOLN
INTRAVENOUS | Status: DC | PRN
  Administered 2018-01-23: 10 mg via INTRAVENOUS
  Administered 2018-01-23: 60 mg via INTRAVENOUS
  Administered 2018-01-23: 10 mg via INTRAVENOUS

## 2018-01-23 MED ORDER — HYDROCODONE-ACETAMINOPHEN 5-325 MG PO TABS
1.0000 | ORAL_TABLET | ORAL | Status: DC | PRN
  Administered 2018-01-24: 1 via ORAL
  Filled 2018-01-23: qty 1

## 2018-01-23 MED ORDER — LACTATED RINGERS IV SOLN
INTRAVENOUS | Status: DC | PRN
  Administered 2018-01-23 (×2): via INTRAVENOUS

## 2018-01-23 MED ORDER — SODIUM CHLORIDE 0.9% FLUSH: 3.0000 mL | INTRAVENOUS | Status: DC | PRN

## 2018-01-23 MED ORDER — SODIUM CHLORIDE 0.9% FLUSH
3.0000 mL | Freq: Two times a day (BID) | INTRAVENOUS | Status: DC
  Administered 2018-01-23: 3 mL via INTRAVENOUS

## 2018-01-23 MED ORDER — FENTANYL CITRATE (PF) 100 MCG/2ML IJ SOLN: 25.0000 ug | INTRAMUSCULAR | Status: DC | PRN

## 2018-01-23 MED ORDER — BISACODYL 10 MG RE SUPP: 10.0000 mg | Freq: Every day | RECTAL | Status: DC | PRN

## 2018-01-23 MED ORDER — DEXAMETHASONE SODIUM PHOSPHATE 10 MG/ML IJ SOLN
INTRAMUSCULAR | Status: AC
  Filled 2018-01-23: qty 1

## 2018-01-23 MED ORDER — SODIUM CHLORIDE 0.9 % IV SOLN
INTRAVENOUS | Status: DC
  Administered 2018-01-23: 14:00:00 via INTRAVENOUS

## 2018-01-23 MED ORDER — THROMBIN (RECOMBINANT) 20000 UNITS EX SOLR
CUTANEOUS | Status: AC
  Filled 2018-01-23: qty 20000

## 2018-01-23 MED ORDER — PHENYLEPHRINE 40 MCG/ML (10ML) SYRINGE FOR IV PUSH (FOR BLOOD PRESSURE SUPPORT)
PREFILLED_SYRINGE | INTRAVENOUS | Status: DC | PRN
  Administered 2018-01-23 (×2): 80 ug via INTRAVENOUS

## 2018-01-23 MED ORDER — ACETAMINOPHEN 500 MG PO TABS
1000.0000 mg | ORAL_TABLET | Freq: Four times a day (QID) | ORAL | Status: AC
  Administered 2018-01-23 – 2018-01-24 (×4): 1000 mg via ORAL
  Filled 2018-01-23 (×4): qty 2

## 2018-01-23 MED ORDER — PROPOFOL 10 MG/ML IV BOLUS
INTRAVENOUS | Status: DC | PRN
  Administered 2018-01-23: 200 mg via INTRAVENOUS

## 2018-01-23 MED ORDER — SODIUM CHLORIDE 0.9 % IV SOLN
INTRAVENOUS | Status: DC | PRN
  Administered 2018-01-23: 500 mL

## 2018-01-23 MED ORDER — LIDOCAINE 2% (20 MG/ML) 5 ML SYRINGE
INTRAMUSCULAR | Status: DC | PRN
  Administered 2018-01-23: 100 mg via INTRAVENOUS

## 2018-01-23 MED ORDER — THROMBIN 5000 UNITS EX SOLR
CUTANEOUS | Status: AC
  Filled 2018-01-23: qty 5000

## 2018-01-23 MED ORDER — 0.9 % SODIUM CHLORIDE (POUR BTL) OPTIME
TOPICAL | Status: DC | PRN
  Administered 2018-01-23: 1000 mL

## 2018-01-23 MED ORDER — METHOCARBAMOL 1000 MG/10ML IJ SOLN
500.0000 mg | Freq: Four times a day (QID) | INTRAVENOUS | Status: DC | PRN
  Filled 2018-01-23: qty 5

## 2018-01-23 MED ORDER — PHENYLEPHRINE 40 MCG/ML (10ML) SYRINGE FOR IV PUSH (FOR BLOOD PRESSURE SUPPORT)
PREFILLED_SYRINGE | INTRAVENOUS | Status: AC
  Filled 2018-01-23: qty 10

## 2018-01-23 MED ORDER — ACETAMINOPHEN 10 MG/ML IV SOLN
INTRAVENOUS | Status: DC | PRN
  Administered 2018-01-23: 1000 mg via INTRAVENOUS

## 2018-01-23 MED ORDER — FENTANYL CITRATE (PF) 100 MCG/2ML IJ SOLN
INTRAMUSCULAR | Status: DC | PRN
  Administered 2018-01-23: 150 ug via INTRAVENOUS
  Administered 2018-01-23 (×3): 50 ug via INTRAVENOUS

## 2018-01-23 MED ORDER — CHLORHEXIDINE GLUCONATE CLOTH 2 % EX PADS: 6.0000 | MEDICATED_PAD | Freq: Once | CUTANEOUS | Status: DC

## 2018-01-23 MED ORDER — ACETAMINOPHEN 325 MG PO TABS: 650.0000 mg | ORAL_TABLET | ORAL | Status: DC | PRN

## 2018-01-23 MED ORDER — CEFAZOLIN SODIUM 1 G IJ SOLR
INTRAMUSCULAR | Status: AC
  Filled 2018-01-23: qty 10

## 2018-01-23 MED ORDER — LIDOCAINE-EPINEPHRINE 1 %-1:100000 IJ SOLN
INTRAMUSCULAR | Status: DC | PRN
  Administered 2018-01-23: 5 mL

## 2018-01-23 MED ORDER — BUPIVACAINE HCL 0.5 % IJ SOLN
INTRAMUSCULAR | Status: DC | PRN
  Administered 2018-01-23: 5 mL

## 2018-01-23 MED ORDER — FLEET ENEMA 7-19 GM/118ML RE ENEM: 1.0000 | ENEMA | Freq: Once | RECTAL | Status: DC | PRN

## 2018-01-23 MED ORDER — PHENOL 1.4 % MT LIQD
1.0000 | OROMUCOSAL | Status: DC | PRN
  Administered 2018-01-23: 1 via OROMUCOSAL
  Filled 2018-01-23: qty 177

## 2018-01-23 MED ORDER — ONDANSETRON HCL 4 MG/2ML IJ SOLN
INTRAMUSCULAR | Status: AC
  Filled 2018-01-23: qty 2

## 2018-01-23 MED ORDER — LIDOCAINE 2% (20 MG/ML) 5 ML SYRINGE
INTRAMUSCULAR | Status: AC
  Filled 2018-01-23: qty 5

## 2018-01-23 MED ORDER — THROMBIN 5000 UNITS EX SOLR
OROMUCOSAL | Status: DC | PRN
  Administered 2018-01-23: 5 mL via TOPICAL

## 2018-01-23 MED ORDER — ONDANSETRON HCL 4 MG PO TABS: 4.0000 mg | ORAL_TABLET | Freq: Four times a day (QID) | ORAL | Status: DC | PRN

## 2018-01-23 MED ORDER — DOCUSATE SODIUM 100 MG PO CAPS
100.0000 mg | ORAL_CAPSULE | Freq: Two times a day (BID) | ORAL | Status: DC
  Administered 2018-01-23 – 2018-01-24 (×3): 100 mg via ORAL
  Filled 2018-01-23 (×3): qty 1

## 2018-01-23 MED ORDER — SUGAMMADEX SODIUM 200 MG/2ML IV SOLN
INTRAVENOUS | Status: DC | PRN
  Administered 2018-01-23: 500 mg via INTRAVENOUS

## 2018-01-23 MED ORDER — MIDAZOLAM HCL 2 MG/2ML IJ SOLN
INTRAMUSCULAR | Status: AC
  Filled 2018-01-23: qty 2

## 2018-01-23 MED ORDER — MEPERIDINE HCL 50 MG/ML IJ SOLN: 6.2500 mg | INTRAMUSCULAR | Status: DC | PRN

## 2018-01-23 MED ORDER — CEFAZOLIN SODIUM-DEXTROSE 2-3 GM-%(50ML) IV SOLR
INTRAVENOUS | Status: DC | PRN
  Administered 2018-01-23: 3 g via INTRAVENOUS

## 2018-01-23 MED ORDER — SUGAMMADEX SODIUM 500 MG/5ML IV SOLN
INTRAVENOUS | Status: AC
  Filled 2018-01-23: qty 5

## 2018-01-23 MED ORDER — THROMBIN 20000 UNITS EX SOLR
CUTANEOUS | Status: DC | PRN
  Administered 2018-01-23: 20 mL via TOPICAL

## 2018-01-23 MED ORDER — ACETAMINOPHEN 650 MG RE SUPP: 650.0000 mg | RECTAL | Status: DC | PRN

## 2018-01-23 MED ORDER — LIDOCAINE-EPINEPHRINE 1 %-1:100000 IJ SOLN
INTRAMUSCULAR | Status: AC
  Filled 2018-01-23: qty 1

## 2018-01-23 MED ORDER — ACETAMINOPHEN 325 MG PO TABS: 325.0000 mg | ORAL_TABLET | ORAL | Status: DC | PRN

## 2018-01-23 MED ORDER — ROCURONIUM BROMIDE 10 MG/ML (PF) SYRINGE
PREFILLED_SYRINGE | INTRAVENOUS | Status: AC
  Filled 2018-01-23: qty 10

## 2018-01-23 MED ORDER — OXYCODONE HCL 5 MG/5ML PO SOLN: 5.0000 mg | Freq: Once | ORAL | Status: AC | PRN

## 2018-01-23 MED ORDER — ACETAMINOPHEN 10 MG/ML IV SOLN
INTRAVENOUS | Status: AC
  Filled 2018-01-23: qty 100

## 2018-01-23 MED ORDER — OXYCODONE HCL 5 MG PO TABS
5.0000 mg | ORAL_TABLET | Freq: Once | ORAL | Status: AC | PRN
  Administered 2018-01-23: 5 mg via ORAL

## 2018-01-23 MED ORDER — OXYMETAZOLINE HCL 0.05 % NA SOLN
NASAL | Status: DC | PRN
  Administered 2018-01-23: 2 via NASAL

## 2018-01-23 MED ORDER — OXYMETAZOLINE HCL 0.05 % NA SOLN
NASAL | Status: AC
  Filled 2018-01-23: qty 15

## 2018-01-23 MED ORDER — ONDANSETRON HCL 4 MG/2ML IJ SOLN: 4.0000 mg | Freq: Once | INTRAMUSCULAR | Status: DC | PRN

## 2018-01-23 MED ORDER — MENTHOL 3 MG MT LOZG: 1.0000 | LOZENGE | OROMUCOSAL | Status: DC | PRN

## 2018-01-23 MED ORDER — SENNOSIDES-DOCUSATE SODIUM 8.6-50 MG PO TABS: 1.0000 | ORAL_TABLET | Freq: Every evening | ORAL | Status: DC | PRN

## 2018-01-23 MED ORDER — SENNA 8.6 MG PO TABS
1.0000 | ORAL_TABLET | Freq: Two times a day (BID) | ORAL | Status: DC
  Administered 2018-01-23 – 2018-01-24 (×3): 8.6 mg via ORAL
  Filled 2018-01-23 (×3): qty 1

## 2018-01-23 SURGICAL SUPPLY — 65 items
BAG DECANTER FOR FLEXI CONT (MISCELLANEOUS) ×2 IMPLANT
BENZOIN TINCTURE PRP APPL 2/3 (GAUZE/BANDAGES/DRESSINGS) IMPLANT
BLADE CLIPPER SURG (BLADE) IMPLANT
BLADE SURG 11 STRL SS (BLADE) ×2 IMPLANT
BLADE ULTRA TIP 2M (BLADE) IMPLANT
BNDG GAUZE ELAST 4 BULKY (GAUZE/BANDAGES/DRESSINGS) IMPLANT
BUR MATCHSTICK NEURO 3.0 LAGG (BURR) ×2 IMPLANT
CAGE EXPANSE 8X6X6 (Cage) ×4 IMPLANT
CAGE PEEK 6X14X11 (Cage) ×4 IMPLANT
CANISTER SUCT 3000ML PPV (MISCELLANEOUS) ×2 IMPLANT
CARTRIDGE OIL MAESTRO DRILL (MISCELLANEOUS) ×1 IMPLANT
DECANTER SPIKE VIAL GLASS SM (MISCELLANEOUS) ×2 IMPLANT
DERMABOND ADVANCED (GAUZE/BANDAGES/DRESSINGS) ×1
DERMABOND ADVANCED .7 DNX12 (GAUZE/BANDAGES/DRESSINGS) ×1 IMPLANT
DIFFUSER DRILL AIR PNEUMATIC (MISCELLANEOUS) ×2 IMPLANT
DRAIN CHANNEL 10M FLAT 3/4 FLT (DRAIN) IMPLANT
DRAPE C-ARM 42X72 X-RAY (DRAPES) ×4 IMPLANT
DRAPE HALF SHEET 40X57 (DRAPES) ×2 IMPLANT
DRAPE LAPAROTOMY 100X72 PEDS (DRAPES) ×2 IMPLANT
DRAPE MICROSCOPE LEICA (MISCELLANEOUS) ×2 IMPLANT
DRSG OPSITE 4X5.5 SM (GAUZE/BANDAGES/DRESSINGS) ×4 IMPLANT
DRSG OPSITE POSTOP 3X4 (GAUZE/BANDAGES/DRESSINGS) IMPLANT
DRSG OPSITE POSTOP 4X6 (GAUZE/BANDAGES/DRESSINGS) ×2 IMPLANT
DURAPREP 6ML APPLICATOR 50/CS (WOUND CARE) ×2 IMPLANT
ELECT COATED BLADE 2.86 ST (ELECTRODE) ×2 IMPLANT
ELECT REM PT RETURN 9FT ADLT (ELECTROSURGICAL) ×2
ELECTRODE REM PT RTRN 9FT ADLT (ELECTROSURGICAL) ×1 IMPLANT
EVACUATOR SILICONE 100CC (DRAIN) IMPLANT
GAUZE SPONGE 4X4 16PLY XRAY LF (GAUZE/BANDAGES/DRESSINGS) IMPLANT
GLOVE BIO SURGEON STRL SZ7.5 (GLOVE) IMPLANT
GLOVE BIOGEL PI IND STRL 7.5 (GLOVE) ×4 IMPLANT
GLOVE BIOGEL PI INDICATOR 7.5 (GLOVE) ×4
GLOVE ECLIPSE 7.0 STRL STRAW (GLOVE) ×4 IMPLANT
GLOVE EXAM NITRILE LRG STRL (GLOVE) IMPLANT
GLOVE EXAM NITRILE XL STR (GLOVE) IMPLANT
GLOVE EXAM NITRILE XS STR PU (GLOVE) IMPLANT
GLOVE SURG SS PI 7.0 STRL IVOR (GLOVE) ×8 IMPLANT
GOWN STRL REUS W/ TWL LRG LVL3 (GOWN DISPOSABLE) ×4 IMPLANT
GOWN STRL REUS W/ TWL XL LVL3 (GOWN DISPOSABLE) ×1 IMPLANT
GOWN STRL REUS W/TWL 2XL LVL3 (GOWN DISPOSABLE) IMPLANT
GOWN STRL REUS W/TWL LRG LVL3 (GOWN DISPOSABLE) ×4
GOWN STRL REUS W/TWL XL LVL3 (GOWN DISPOSABLE) ×1
HEMOSTAT POWDER KIT SURGIFOAM (HEMOSTASIS) ×2 IMPLANT
KIT BASIN OR (CUSTOM PROCEDURE TRAY) ×2 IMPLANT
KIT TURNOVER KIT B (KITS) ×2 IMPLANT
NEEDLE HYPO 22GX1.5 SAFETY (NEEDLE) ×2 IMPLANT
NEEDLE SPNL 22GX3.5 QUINCKE BK (NEEDLE) ×2 IMPLANT
NS IRRIG 1000ML POUR BTL (IV SOLUTION) ×2 IMPLANT
OIL CARTRIDGE MAESTRO DRILL (MISCELLANEOUS) ×2
PACK LAMINECTOMY NEURO (CUSTOM PROCEDURE TRAY) ×2 IMPLANT
PAD ARMBOARD 7.5X6 YLW CONV (MISCELLANEOUS) ×6 IMPLANT
PLATE 2 42.5XLCK NS SPNE CVD (Plate) ×1 IMPLANT
PLATE 2 ATLANTIS TRANS (Plate) ×1 IMPLANT
RUBBERBAND STERILE (MISCELLANEOUS) ×4 IMPLANT
SCREW SELF TAP VAR 4.0X13 (Screw) ×12 IMPLANT
SPONGE INTESTINAL PEANUT (DISPOSABLE) ×2 IMPLANT
SPONGE SURGIFOAM ABS GEL 100 (HEMOSTASIS) ×2 IMPLANT
STRIP CLOSURE SKIN 1/2X4 (GAUZE/BANDAGES/DRESSINGS) IMPLANT
SUT ETHILON 3 0 FSL (SUTURE) IMPLANT
SUT VIC AB 3-0 SH 8-18 (SUTURE) ×2 IMPLANT
SUT VICRYL 3-0 RB1 18 ABS (SUTURE) ×4 IMPLANT
TAPE CLOTH 3X10 TAN LF (GAUZE/BANDAGES/DRESSINGS) ×2 IMPLANT
TOWEL GREEN STERILE (TOWEL DISPOSABLE) ×2 IMPLANT
TOWEL GREEN STERILE FF (TOWEL DISPOSABLE) ×2 IMPLANT
WATER STERILE IRR 1000ML POUR (IV SOLUTION) ×2 IMPLANT

## 2018-01-23 NOTE — Anesthesia Procedure Notes (Addendum)
Procedure Name: Intubation Date/Time: 01/23/2018 7:51 AM Performed by: Orlie Dakin, CRNA Pre-anesthesia Checklist: Patient identified, Emergency Drugs available, Suction available, Patient being monitored and Timeout performed Patient Re-evaluated:Patient Re-evaluated prior to induction Oxygen Delivery Method: Circle system utilized Preoxygenation: Pre-oxygenation with 100% oxygen Induction Type: IV induction Ventilation: Mask ventilation without difficulty Laryngoscope Size: Glidescope and 4 Grade View: Grade I Tube type: Oral Tube size: 7.0 mm Number of attempts: 1 Airway Equipment and Method: Stylet and Video-laryngoscopy Placement Confirmation: ETT inserted through vocal cords under direct vision,  positive ETCO2 and breath sounds checked- equal and bilateral Secured at: 22 cm Tube secured with: Tape Dental Injury: Teeth and Oropharynx as per pre-operative assessment  Comments: Glidescope electively used due to pre-op neck pain, right arm pain, and 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  4x4s bite block used.

## 2018-01-23 NOTE — Anesthesia Postprocedure Evaluation (Signed)
Anesthesia Post Note  Patient: Brileigh Gundry  Procedure(s) Performed: ANTERIOR CERVICAL DECOMPRESSION/DISCECTOMY FUSION CERVICAL THREE- CERVICAL FOUR, CERVICAL FOUR- CERVICAL FIVE (N/A Spine Cervical)     Patient location during evaluation: PACU Anesthesia Type: General Level of consciousness: awake and alert Pain management: pain level controlled Vital Signs Assessment: post-procedure vital signs reviewed and stable Respiratory status: spontaneous breathing, nonlabored ventilation, respiratory function stable and patient connected to nasal cannula oxygen Cardiovascular status: blood pressure returned to baseline and stable Postop Assessment: no apparent nausea or vomiting Anesthetic complications: no    Last Vitals:  Vitals:   01/23/18 1215 01/23/18 1230  BP: (!) 147/87 (!) 155/88  Pulse: 62 66  Resp: 13 16  Temp:    SpO2: 99% 100%    Last Pain:  Vitals:   01/23/18 1045  TempSrc:   PainSc: Asleep                 Boone Gear

## 2018-01-23 NOTE — Transfer of Care (Signed)
Immediate Anesthesia Transfer of Care Note  Patient: Barbara Richardson  Procedure(s) Performed: ANTERIOR CERVICAL DECOMPRESSION/DISCECTOMY FUSION CERVICAL THREE- CERVICAL FOUR, CERVICAL FOUR- CERVICAL FIVE (N/A Spine Cervical)  Patient Location: PACU  Anesthesia Type:General  Level of Consciousness: awake and patient cooperative  Airway & Oxygen Therapy: Patient Spontanous Breathing and Patient connected to face mask oxygen  Post-op Assessment: Report given to RN, Post -op Vital signs reviewed and stable and Patient moving all extremities X 4  Post vital signs: Reviewed and stable  Last Vitals:  Vitals Value Taken Time  BP    Temp    Pulse 70 01/23/2018 10:43 AM  Resp 19 01/23/2018 10:43 AM  SpO2 100 % 01/23/2018 10:43 AM  Vitals shown include unvalidated device data.  Last Pain:  Vitals:   01/23/18 0614  TempSrc: Oral  PainSc:       Patients Stated Pain Goal: 3 (97/41/63 [id number])  Complications: No apparent anesthesia complications

## 2018-01-24 DIAGNOSIS — M5011 Cervical disc disorder with radiculopathy,  high cervical region: Secondary | ICD-10-CM | POA: Diagnosis not present

## 2018-01-24 LAB — CBC
HCT: 39.7 % (ref 36.0–46.0)
Hemoglobin: 12.6 g/dL (ref 12.0–15.0)
MCH: 29.2 pg (ref 26.0–34.0)
MCHC: 31.7 g/dL (ref 30.0–36.0)
MCV: 92.1 fL (ref 78.0–100.0)
Platelets: 269 10*3/uL (ref 150–400)
RBC: 4.31 MIL/uL (ref 3.87–5.11)
RDW: 12.8 % (ref 11.5–15.5)
WBC: 8.9 10*3/uL (ref 4.0–10.5)

## 2018-01-24 LAB — BASIC METABOLIC PANEL
Anion gap: 7 (ref 5–15)
BUN: 10 mg/dL (ref 6–20)
CO2: 20 mmol/L — ABNORMAL LOW (ref 22–32)
Calcium: 8.8 mg/dL — ABNORMAL LOW (ref 8.9–10.3)
Chloride: 110 mmol/L (ref 98–111)
Creatinine, Ser: 0.93 mg/dL (ref 0.44–1.00)
GFR calc Af Amer: >60 mL/min (ref >60)
GFR calc non Af Amer: >60 mL/min (ref >60)
Glucose, Bld: 97 mg/dL (ref 70–99)
Potassium: 3.6 mmol/L (ref 3.5–5.1)
Sodium: 137 mmol/L (ref 135–145)

## 2018-01-24 LAB — PROTIME-INR
INR: 1.25
Prothrombin Time: 15.6 seconds — ABNORMAL HIGH (ref 11.4–15.2)

## 2018-01-24 LAB — APTT: aPTT: 34 s (ref 24–36)

## 2018-01-24 MED ORDER — HYDROCODONE-ACETAMINOPHEN 5-325 MG PO TABS: 1.0000 | ORAL_TABLET | ORAL | 0 refills | Status: DC | PRN

## 2018-01-24 MED ORDER — METHOCARBAMOL 500 MG PO TABS: 500.0000 mg | ORAL_TABLET | Freq: Four times a day (QID) | ORAL | 0 refills | Status: AC | PRN

## 2018-01-24 NOTE — Evaluation (Signed)
Physical Therapy Evaluation Patient Details Name: Barbara Richardson MRN: [id number] DOB: [birth year] Today's Date: 01/24/2018   History of Present Illness  Patient is a 47 yo female s/p ACDF  Clinical Impression  Patient seen for mobility assessment s/p spinal surgery. Mobilizing well. Educated patient on precautions, mobility expectations, safety and car transfers. No further acute PT needs. Will sign off.     Follow Up Recommendations No PT follow up    Equipment Recommendations  None recommended by PT    Recommendations for Other Services       Precautions / Restrictions Precautions Precautions: Cervical Precaution Booklet Issued: Yes (comment) Required Braces or Orthoses: Cervical Brace Cervical Brace: Hard collar      Mobility  Bed Mobility Overal bed mobility: Independent                Transfers Overall transfer level: Independent                  Ambulation/Gait Ambulation/Gait assistance: Independent Gait Distance (Feet): 340 Feet Assistive device: None Gait Pattern/deviations: Wide base of support   Gait velocity interpretation: 1.31 - 2.62 ft/sec, indicative of limited community ambulator    Stairs Stairs: Yes Stairs assistance: Supervision Stair Management: One rail Right Number of Stairs: 12 General stair comments: VCs for sequencing and blind negotiation  Wheelchair Mobility    Modified Rankin (Stroke Patients Only)       Balance                                             Pertinent Vitals/Pain Pain Assessment: Faces Faces Pain Scale: Hurts a little bit Pain Location: operative site Pain Descriptors / Indicators: Sore Pain Intervention(s): Monitored during session    Home Living Family/patient expects to be discharged to:: Private residence Living Arrangements: Spouse/significant other Available Help at Discharge: Family Type of Home: House Home Access: Stairs to enter Entrance Stairs-Rails:  None Technical brewer of Steps: 1 Home Layout: Two level;Bed/bath upstairs        Prior Function Level of Independence: Independent               Hand Dominance   Dominant Hand: Right    Extremity/Trunk Assessment   Upper Extremity Assessment Upper Extremity Assessment: Defer to OT evaluation    Lower Extremity Assessment Lower Extremity Assessment: Overall WFL for tasks assessed       Communication   Communication: No difficulties  Cognition Arousal/Alertness: Awake/alert Behavior During Therapy: WFL for tasks assessed/performed Overall Cognitive Status: Within Functional Limits for tasks assessed                                        General Comments      Exercises     Assessment/Plan    PT Assessment Patent does not need any further PT services  PT Problem List         PT Treatment Interventions      PT Goals (Current goals can be found in the Care Plan section)  Acute Rehab PT Goals PT Goal Formulation: All assessment and education complete, DC therapy    Frequency     Barriers to discharge        Co-evaluation  AM-PAC PT "6 Clicks" Daily Activity  Outcome Measure Difficulty turning over in bed (including adjusting bedclothes, sheets and blankets)?: None Difficulty moving from lying on back to sitting on the side of the bed? : None Difficulty sitting down on and standing up from a chair with arms (e.g., wheelchair, bedside commode, etc,.)?: None Help needed moving to and from a bed to chair (including a wheelchair)?: None Help needed walking in hospital room?: A Little Help needed climbing 3-5 steps with a railing? : A Little 6 Click Score: 22    End of Session Equipment Utilized During Treatment: Cervical collar Activity Tolerance: Patient tolerated treatment well Patient left: in bed;with call bell/phone within reach Nurse Communication: Mobility status PT Visit Diagnosis: Pain Pain -  Right/Left: (neck)    Time: 5631-4970 PT Time Calculation (min) (ACUTE ONLY): 18 min   Charges:   PT Evaluation $PT Eval Low Complexity: [place], PT DPT  Board Certified Neurologic Specialist [place] 01/24/2018, 7:17 AM

## 2018-01-24 NOTE — Progress Notes (Signed)
Patient is discharged from room 3C09 at this time. Alert and in stable condition. IV site d/c'd and instructions read to patient and family with understanding verbalized. Left unit via wheelchair with all belongings at side.

## 2018-01-24 NOTE — Discharge Summary (Signed)
Physician Discharge Summary  Patient ID: Barbara Richardson MRN: [id number] DOB/AGE: 1970/11/24 47 y.o.  Admit date: 01/23/2018 Discharge date: 01/24/2018  Admission Diagnoses: HNP C3-4, 4-5    Discharge Diagnoses: same   Discharged Condition: good  Hospital Course: The patient was admitted on 01/23/2018 and taken to the operating room where the patient underwent ACDF C3-4, 4-5. The patient tolerated the procedure well and was taken to the recovery room and then to the floor in stable condition. The hospital course was routine. There were no complications. The wound remained clean dry and intact. Pt had appropriate neck soreness. No complaints of arm pain or new N/T/W. The patient remained afebrile with stable vital signs, and tolerated a regular diet. The patient continued to increase activities, and pain was well controlled with oral pain medications.   Consults: None  Significant Diagnostic Studies:  Results for orders placed or performed during the hospital encounter of 01/23/18  Surgical pcr screen  Result Value Ref Range   MRSA, PCR NEGATIVE NEGATIVE   Staphylococcus aureus NEGATIVE NEGATIVE  Basic metabolic panel  Result Value Ref Range   Sodium 139 135 - 145 mmol/L   Potassium 4.7 3.5 - 5.1 mmol/L   Chloride 108 98 - 111 mmol/L   CO2 21 (L) 22 - 32 mmol/L   Glucose, Bld 74 70 - 99 mg/dL   BUN 18 6 - 20 mg/dL   Creatinine, Ser 1.15 (H) 0.44 - 1.00 mg/dL   Calcium 8.8 (L) 8.9 - 10.3 mg/dL   GFR calc non Af Amer 55 (L) >60 mL/min   GFR calc Af Amer >60 >60 mL/min   Anion gap 10 5 - 15  CBC  Result Value Ref Range   WBC 6.0 4.0 - 10.5 K/uL   RBC 4.74 3.87 - 5.11 MIL/uL   Hemoglobin 14.0 12.0 - 15.0 g/dL   HCT 44.4 36.0 - 46.0 %   MCV 93.7 78.0 - 100.0 fL   MCH 29.5 26.0 - 34.0 pg   MCHC 31.5 30.0 - 36.0 g/dL   RDW 13.0 11.5 - 15.5 %   Platelets 275 150 - 400 K/uL  CBC  Result Value Ref Range   WBC 8.9 4.0 - 10.5 K/uL   RBC 4.31 3.87 - 5.11 MIL/uL   Hemoglobin 12.6  12.0 - 15.0 g/dL   HCT 39.7 36.0 - 46.0 %   MCV 92.1 78.0 - 100.0 fL   MCH 29.2 26.0 - 34.0 pg   MCHC 31.7 30.0 - 36.0 g/dL   RDW 12.8 11.5 - 15.5 %   Platelets 269 150 - 400 K/uL  Protime-INR  Result Value Ref Range   Prothrombin Time 15.6 (H) 11.4 - 15.2 seconds   INR 1.25   APTT  Result Value Ref Range   aPTT 34 24 - 36 seconds  Pregnancy, urine POC  Result Value Ref Range   Preg Test, Ur NEGATIVE NEGATIVE  Type and screen  Result Value Ref Range   ABO/RH(D) O POS    Antibody Screen NEG    Sample Expiration      01/26/2018 Performed at [facility], [address], [place], [place] [address]   ABO/Rh  Result Value Ref Range   ABO/RH(D)      O POS Performed at [facility] [address] [place], [place] [address]     Dg Cervical Spine 1 View  Result Date: 01/23/2018 CLINICAL DATA:  ACDF C3 through C5 EXAM: DG C-ARM 61-120 MIN; DG CERVICAL  SPINE - 1 VIEW COMPARISON:  Cervical MRI 09/27/2017 FINDINGS: Single lateral image of the cervical spine was obtained in the operating room. ACDF with plate and interbody spacers at C3-4 and C4-5 in good position. Sponge in the anterior soft tissues at the C3 level. IMPRESSION: Satisfactory ACDF C3 through C5. Surgical sponge in the soft tissues. Electronically Signed   By: Franchot Gallo M.D.   On: 01/23/2018 11:28   Dg C-arm 1-60 Min  Result Date: 01/23/2018 CLINICAL DATA:  ACDF C3 through C5 EXAM: DG C-ARM 61-120 MIN; DG CERVICAL SPINE - 1 VIEW COMPARISON:  Cervical MRI 09/27/2017 FINDINGS: Single lateral image of the cervical spine was obtained in the operating room. ACDF with plate and interbody spacers at C3-4 and C4-5 in good position. Sponge in the anterior soft tissues at the C3 level. IMPRESSION: Satisfactory ACDF C3 through C5. Surgical sponge in the soft tissues. Electronically Signed   By: Franchot Gallo M.D.   On: 01/23/2018 11:28    Antibiotics:  Anti-infectives (From admission, onward)   Start      Dose/Rate Route Frequency Ordered Stop   01/23/18 1600  ceFAZolin (ANCEF) IVPB 2g/100 mL premix     2 g 200 mL/hr over 30 Minutes Intravenous Every 8 hours 01/23/18 1336 01/24/18 0020   01/23/18 0832  bacitracin 50,000 Units in sodium chloride 0.9 % 500 mL irrigation  Status:  Discontinued       As needed 01/23/18 0832 01/23/18 1036   01/23/18 0731  ceFAZolin (ANCEF) 2-4 GM/100ML-% IVPB    Note to Pharmacy:  Vania Rea   : cabinet override      01/23/18 0731 01/23/18 1944   01/06/18 0600  ceFAZolin (ANCEF) 3 g in dextrose 5 % 50 mL IVPB     3 g 100 mL/hr over 30 Minutes Intravenous On call to O.R. 01/05/18 1108 01/07/18 0559      Discharge Exam: Blood pressure (!) 116/56, pulse 70, temperature 97.7 F (36.5 C), temperature source Oral, resp. rate 18, height 5\' 9"  (1.753 m), weight (!) 158.8 kg (350 lb), SpO2 99 %. Neurologic: Grossly normal Ambulating and voiding well  Discharge Medications:   Allergies as of 01/24/2018      Reactions   Other Hives   Peaches      Medication List    TAKE these medications   albuterol (2.5 MG/3ML) 0.083% nebulizer solution Commonly known as:  PROVENTIL Take 2.5 mg by nebulization every 6 (six) hours as needed for wheezing or shortness of breath.   albuterol 108 (90 Base) MCG/ACT inhaler Commonly known as:  PROVENTIL HFA;VENTOLIN HFA Inhale 2 puffs into the lungs every 6 (six) hours as needed for wheezing.   amLODipine 5 MG tablet Commonly known as:  NORVASC Take 5 mg by mouth daily.   aspirin EC 81 MG tablet Take 81 mg by mouth daily.   budesonide-formoterol 160-4.5 MCG/ACT inhaler Commonly known as:  SYMBICORT Inhale 2 puffs into the lungs 2 (two) times daily.   budesonide-formoterol 80-4.5 MCG/ACT inhaler Commonly known as:  SYMBICORT Inhale 2 puffs into the lungs 2 (two) times daily.   buPROPion 300 MG 24 hr tablet Commonly known as:  WELLBUTRIN XL Take 300 mg by mouth daily.   docusate sodium 100 MG capsule Commonly known  as:  COLACE Take 100 mg by mouth daily.   FLUoxetine 20 MG tablet Commonly known as:  PROZAC Take 20 mg by mouth daily.   furosemide 20 MG tablet Commonly known as:  LASIX Take  20 mg by mouth 2 (two) times daily.   gabapentin 300 MG capsule Commonly known as:  NEURONTIN Take 300 mg by mouth 2 (two) times daily.   HYDROcodone-acetaminophen 5-325 MG tablet Commonly known as:  NORCO/VICODIN Take 1 tablet by mouth every 4 (four) hours as needed for moderate pain ((score 4 to 6)).   IMITREX 100 MG tablet Generic drug:  SUMAtriptan Take 100 mg by mouth daily as needed (migraine).   losartan 100 MG tablet Commonly known as:  COZAAR Take 100 mg by mouth daily.   methocarbamol 500 MG tablet Commonly known as:  ROBAXIN Take 1 tablet (500 mg total) by mouth every 6 (six) hours as needed for muscle spasms.   metoprolol succinate 25 MG 24 hr tablet Commonly known as:  TOPROL-XL Take 25 mg by mouth daily.   montelukast 10 MG tablet Commonly known as:  SINGULAIR Take 10 mg by mouth at bedtime.   naproxen 500 MG tablet Commonly known as:  NAPROSYN Take 500 mg by mouth 2 (two) times daily with a meal.   omeprazole 40 MG capsule Commonly known as:  PRILOSEC Take 40 mg by mouth daily.   tiZANidine 4 MG capsule Commonly known as:  ZANAFLEX Take 4 mg by mouth 3 (three) times daily.   topiramate 100 MG tablet Commonly known as:  TOPAMAX Take 100 mg by mouth 2 (two) times daily.   traMADol 50 MG tablet Commonly known as:  ULTRAM Take 50 mg by mouth 2 (two) times daily.       Disposition: home   Final Dx: acdf C3-4, 4-5  Discharge Instructions     Remove dressing in 72 hours   Complete by:  As directed    Call MD for:  difficulty breathing, headache or visual disturbances   Complete by:  As directed    Call MD for:  hives   Complete by:  As directed    Call MD for:  persistant dizziness or light-headedness   Complete by:  As directed    Call MD for:  persistant  nausea and vomiting   Complete by:  As directed    Call MD for:  redness, tenderness, or signs of infection (pain, swelling, redness, odor or green/yellow discharge around incision site)   Complete by:  As directed    Call MD for:  severe uncontrolled pain   Complete by:  As directed    Call MD for:  temperature >100.4   Complete by:  As directed    Diet - low sodium heart healthy   Complete by:  As directed    Driving Restrictions   Complete by:  As directed    No dirving 2 weeks   Increase activity slowly   Complete by:  As directed       Follow-up Information    Consuella Lose, MD. Schedule an appointment as soon as possible for a visit in 2 week(s).   Specialty:  Neurosurgery Contact information: [address] [place] [address] [place] [address] [phone]            Signed: Ocie Cornfield [facility] 01/24/2018, 8:07 AM

## 2018-01-24 NOTE — Discharge Instructions (Signed)
Wound Care  Keep the incision clean and dry remove the outer dressing in 2 days. Leave open to air. Activity Walk each and every day, increasing distance each day. No lifting greater than 5 lbs.  Avoid excessive neck motion. No lifting no bending no twisting no driving or riding a car unless coming back and forth to see me. Wear neck brace at all times except when showering.   Diet Resume your normal diet.   Return to Work Will be discussed at you follow up appointment.  Call Your Doctor If Any of These Occur Redness, drainage, or swelling at the wound.  Temperature greater than 101 degrees. Severe pain not relieved by pain medication. Incision starts to come apart. Follow Up Appt Call today for appointment in 1-2 weeks ([phone]) or for problems.  If you have any hardware placed in your spine, you will need an x-ray before your appointment.

## 2018-01-24 NOTE — Evaluation (Signed)
Occupational Therapy Evaluation and Discharge  Patient Details Name: Barbara Richardson MRN: [id number] DOB: May 01, 1971 Today's Date: 01/24/2018    History of Present Illness Patient is a 47 yo female s/p ACDF. Patient is a 47 yo female s/p ACDF. PMH significant for morbid obesity due to excess calories, OSA, inadequatesleep hygiene, dyspnea on exertion, essential hypertension, cough variant asthma, and GERD.   Clinical Impression   PTA, pt was independent with ADL and functional mobility. Pt and husband educated concerning cervical precautions, compensatory strategies for ADL participation, and cervical brace management. She is able to complete all ADL at modified independent level at this time. Pt reports that she is eager to return home today and will have assistance from her husband and children. No further acute OT needs identified. OT will sign off.    Follow Up Recommendations  No OT follow up;Supervision/Assistance - 24 hour    Equipment Recommendations  None recommended by OT    Recommendations for Other Services       Precautions / Restrictions Precautions Precautions: Cervical Precaution Booklet Issued: Yes (comment) Precaution Comments: Handout provided by PT. Educated pt concerning cervical precautions related to ADL participation.  Required Braces or Orthoses: Cervical Brace Cervical Brace: Hard collar(wearing hard collar; note order for soft collar) Restrictions Weight Bearing Restrictions: No      Mobility Bed Mobility Overal bed mobility: Modified Independent             General bed mobility comments: HOB slightly elevated to simulate home.   Transfers Overall transfer level: Modified independent               General transfer comment: Increased time and effort.     Balance Overall balance assessment: No apparent balance deficits (not formally assessed)                                         ADL either performed or assessed  with clinical judgement   ADL Overall ADL's : Modified independent                                       General ADL Comments: Pt able to complete basic ADL with modified independence. Educated concerning cervical collar wearing schedule, safe showering techniques, safe tub/shower transfers, limit AROM B shoulders greater than 90 degrees. Discussed 2-cup method for oral care at sink. Educated pt and husband concerning changing out padding within hard collar when soiled.      Vision Patient Visual Report: No change from baseline Vision Assessment?: No apparent visual deficits     Perception     Praxis      Pertinent Vitals/Pain Pain Assessment: No/denies pain Faces Pain Scale: Hurts a little bit Pain Location: operative site Pain Descriptors / Indicators: Sore Pain Intervention(s): Monitored during session     Hand Dominance Right   Extremity/Trunk Assessment Upper Extremity Assessment Upper Extremity Assessment: Overall WFL for tasks assessed   Lower Extremity Assessment Lower Extremity Assessment: Overall WFL for tasks assessed   Cervical / Trunk Assessment Cervical / Trunk Assessment: Other exceptions Cervical / Trunk Exceptions: s/p ACDF   Communication Communication Communication: No difficulties   Cognition Arousal/Alertness: Awake/alert Behavior During Therapy: WFL for tasks assessed/performed Overall Cognitive Status: Within Functional Limits for tasks assessed  General Comments  Husband present throughout session. He will be able to provide all necessary assistance.     Exercises     Shoulder Instructions      Home Living Family/patient expects to be discharged to:: Private residence Living Arrangements: Spouse/significant other Available Help at Discharge: Family Type of Home: House Home Access: Stairs to enter Technical brewer of Steps: 1 Entrance Stairs-Rails: None Home  Layout: Two level;Bed/bath upstairs Alternate Level Stairs-Number of Steps: 12 Alternate Level Stairs-Rails: Can reach both Bathroom Shower/Tub: Teacher, early years/pre: Handicapped height     Home Equipment: Shower seat;Grab bars - toilet;Grab bars - tub/shower;Hand held shower head   Additional Comments: Pt planning to stay upstairs once she is there.       Prior Functioning/Environment Level of Independence: Independent                 OT Problem List: Decreased activity tolerance;Impaired balance (sitting and/or standing);Decreased safety awareness;Pain      OT Treatment/Interventions:      OT Goals(Current goals can be found in the care plan section) Acute Rehab OT Goals Patient Stated Goal: "to wash up before I get dressed" OT Goal Formulation: With patient/family  OT Frequency:     Barriers to D/C:            Co-evaluation              AM-PAC PT "6 Clicks" Daily Activity     Outcome Measure Help from another person eating meals?: None Help from another person taking care of personal grooming?: None Help from another person toileting, which includes using toliet, bedpan, or urinal?: None Help from another person bathing (including washing, rinsing, drying)?: None Help from another person to put on and taking off regular upper body clothing?: None Help from another person to put on and taking off regular lower body clothing?: None 6 Click Score: 24   End of Session Equipment Utilized During Treatment: Cervical collar Nurse Communication: Mobility status;Other (comment)(OT complete)  Activity Tolerance: Patient tolerated treatment well Patient left: Other (comment)(in bathroom completing ADL tasks with husband present)  OT Visit Diagnosis: Other abnormalities of gait and mobility (R26.89)                Time: 9643-8381 OT Time Calculation (min): 19 min Charges:  OT Evaluation $OT Eval Low Complexity: 1 Low  Norman Herrlich, MS OTR/L   Pager: [facility] [place] 01/24/2018, 9:36 AM

## 2018-01-26 MED FILL — Thrombin (Recombinant) For Soln 20000 Unit: CUTANEOUS | Qty: 1 | Status: AC

## 2018-01-28 ENCOUNTER — Encounter (HOSPITAL_COMMUNITY): Payer: Self-pay | Admitting: Neurosurgery

## 2018-02-03 NOTE — Op Note (Signed)
NEUROSURGERY OPERATIVE NOTE   PREOP DIAGNOSIS: Cervical disc disease with radiculopathy, C3-4, C4-5  POSTOP DIAGNOSIS: Same  PROCEDURE: 1. Discectomy at C3-4, C4-5 for decompression of spinal cord and exiting nerve roots  2. Placement of intervertebral biomechanical device, Medtronic PTC PEEK cages 3. Placement of anterior instrumentation consisting of interbody plate and screws spanning C3-C5 - Medtronic Atlantis translational plate 4. Use of morselized bone allograft  5. Arthrodesis C3-4, C4-5, anterior interbody technique  6. Use of intraoperative microscope  SURGEON: Dr. Consuella Lose, MD  ASSISTANT: Ferne Reus, PA-C  ANESTHESIA: General Endotracheal  EBL: 50cc  SPECIMENS: None  DRAINS: None  COMPLICATIONS: None immediate  CONDITION: Hemodynamically stable to PACU  HISTORY: Barbara Richardson is a 47 y.o. woman initially presented to the outpatient neurosurgery clinic with neck and bilateral shoulder and arm pain.  Her MRI has demonstrated significant central and foraminal stenosis worst at C3-4 and C4-5.  After lengthy discussion in the office, the patient elected to proceed with surgical decompression and fusion.  Risks and benefits were discussed extensively.  After all questions were answered informed consent was obtained and witnessed.  PROCEDURE IN DETAIL: The patient was brought to the operating room and transferred to the operative table. After induction of general anesthesia, the patient was positioned on the operative table in the supine position with all pressure points meticulously padded. The skin of the neck was then prepped and draped in the usual sterile fashion.  After timeout was conducted, the skin was infiltrated with local anesthetic. Skin incision was then made sharply and Bovie electrocautery was used to dissect the subcutaneous tissue until the platysma was identified. The platysma was then divided and undermined. The sternocleidomastoid  muscle was then identified and, utilizing natural fascial planes in the neck, the prevertebral fascia was identified and the carotid sheath was retracted laterally and the trachea and esophagus retracted medially. Again using fluoroscopy, the correct disc spaces were identified. Bovie electrocautery was used to dissect in the subperiosteal plane and elevate the bilateral longus coli muscles. Table mounted retractors were then placed. At this point, the microscope was draped and brought into the field, and the remainder of the case was done under the microscope using microdissecting technique.  The C3-4 disc space was incised sharply and rongeurs were use to initially complete a discectomy. The high-speed drill was then used to complete discectomy until the posterior annulus was identified and removed and the posterior longitudinal ligament was identified. Using a nerve hook, the PLL was elevated, and Kerrison rongeurs were used to remove the posterior longitudinal ligament and the ventral thecal sac was identified. Using a combination of curettes and rongeurs, complete decompression of the thecal sac and exiting nerve roots at this level was completed, and verified using micro-nerve hook.  This did require removal of the uncovertebral joints in order to fully visualize the proximal portion of bilateral C4 nerve roots.  At this point, 6 mm interbody cage was sized and packed with morcellized bone allograft. This was then inserted and tapped into place.  Attention was then turned to the C4-5 level. In a similar fashion, discectomy was completed initially with curettes and rongeurs, and completed with the drill. The PLL was again identified, elevated and incised. Using Kerrison rongeurs, decompression of the spinal cord and exiting roots at this level was completed and confirmed with a dissector.  I did note a significant amount of subligamentous disc material herniated centrally, and slightly eccentric to the  patient's right.  This was completely  removed.  I removed a portion of the uncovertebral joints in order to fully visualize the proximal portion of the C5 nerve roots.  A 6 mm lordotic interbody cage was then sized and filled with bone allograft, and tapped into place. Position of the interbody devices was then confirmed with fluoroscopy.  After placement of the intervertebral devices, the anterior cervical plate was selected, and placed across the interspaces. Using a high-speed drill, the cortex of the cervical vertebral bodies was punctured, and screws inserted in the C3, C4, and C5 levels. Final fluoroscopic images in lateral projection were taken to confirm good hardware placement.  At this point, after all counts were verified to be correct, meticulous hemostasis was secured using a combination of bipolar electrocautery and passive hemostatics. The platysma muscle was then closed using interrupted 3-0 Vicryl sutures, and the skin was closed with a interrupted subcuticular stitch. Sterile dressings were then applied and the drapes removed.  The patient tolerated the procedure well and was extubated in the room and taken to the postanesthesia care unit in stable condition.

## 2018-02-18 ENCOUNTER — Other Ambulatory Visit: Payer: Self-pay | Admitting: Family Medicine

## 2018-02-18 DIAGNOSIS — R55 Syncope and collapse: Secondary | ICD-10-CM

## 2018-03-07 ENCOUNTER — Ambulatory Visit
Admission: RE | Admit: 2018-03-07 | Discharge: 2018-03-07 | Disposition: A | Discharge status: Home / Self Care | Payer: Medicare Other | Source: Ambulatory Visit | Attending: Family Medicine | Admitting: Family Medicine

## 2018-03-07 DIAGNOSIS — R55 Syncope and collapse: Secondary | ICD-10-CM

## 2018-04-16 ENCOUNTER — Encounter (INDEPENDENT_AMBULATORY_CARE_PROVIDER_SITE_OTHER): Payer: Self-pay

## 2018-04-16 ENCOUNTER — Encounter: Payer: Self-pay | Admitting: Cardiovascular Disease

## 2018-04-16 ENCOUNTER — Ambulatory Visit (INDEPENDENT_AMBULATORY_CARE_PROVIDER_SITE_OTHER): Payer: Medicare Other | Admitting: Cardiovascular Disease

## 2018-04-16 VITALS — BP 124/84 | HR 67 | Ht 69.0 in | Wt 353.4 lb

## 2018-04-16 DIAGNOSIS — R55 Syncope and collapse: Secondary | ICD-10-CM | POA: Diagnosis not present

## 2018-04-16 NOTE — Patient Instructions (Addendum)
Medication Instructions:  Your physician recommends that you continue on your current medications as directed. Please refer to the Current Medication list given to you today.  Labwork: NONE  Testing/Procedures: Your physician has requested that you have an echocardiogram. Echocardiography is a painless test that uses sound waves to create images of your heart. It provides your doctor with information about the size and shape of your heart and how well your heart's chambers and valves are working. This procedure takes approximately one hour. There are no restrictions for this procedure.  Follow-Up: Your physician wants you to follow-up in as needed with Dr. Johnsie Cancel.  If you need a refill on your cardiac medications before your next appointment, please call your pharmacy.

## 2018-04-16 NOTE — Progress Notes (Signed)
Cardiology Office Note   Date:  04/16/2018   ID:  Berlie Persky, DOB 1971/04/12, MRN [id number]  PCP:  Lujean Amel, MD  Cardiologist:   Jenkins Rouge, MD   No chief complaint on file.     History of Present Illness: Barbara Richardson is a 47 y.o. female who presents for consultation regarding "black out" spells. Referred by Dr Dorthy Cooler  Reviewed his note from 02/17/18 She has had multiple dizzy spells some while walking Feels limp Unable to talk or smile Couldn't stand up. Primary referred to cardiology ECG normal Ordered MRI brain but no neurology referral  Labs TSH 2.8 Cr 1.1 K 3.7 Hct 37  Episodes related to emotional stress. She has had this before when in school Was upset with her cousins about not coming to her anniversary Also upset about possibility of losing her disability after her recent neck surgery   Past Medical History:  Diagnosis Date  . Abnormal Pap smear   . Anxiety   . Asthma   . Atrial fibrillation ([place])   . Bipolar disorder ([place])   . BV (bacterial vaginosis) 08/24/2010  . Cervical radiculopathy 01/23/2018  . Cough variant asthma 10/18/2011   05/29/2017  After extensive coaching HFA effectiveness =    90% > continue symb 160 2bid - Allergy profile 05/29/17  >  Eos 0.6 /  IgE  172  RAST pos mild mold only  - FENO 05/29/2017  =   36 -   08/11/2017  After extensive coaching inhaler device  effectiveness =    75% > continue symicort 160  2bid/ singulair  - PFT's  08/11/2017  FEV1 2.04 (77 % ) ratio 73  p 7 % improvement from saba p symb/saba  . DDD (degenerative disc disease), cervical   . DJD (degenerative joint disease)   . DUB (dysfunctional uterine bleeding) 02/08/2010  . Dyspnea   . Dyspnea on exertion 05/06/2014   Followed in Pulmonary clinic/ [facility]/ [facility] - trial off all acei 05/06/2014  > improved 07/20/14 with nl pfts x low erv > try off symbicort  - 05/29/2017  Walked RA x 3 laps @ 185 ft each stopped due to  End of study, fast pace, leg pain  and sob and end with sats down to 90%     . Endometrial polyp 03/2010  . GERD (gastroesophageal reflux disease)   . Hx of chlamydia infection 1995  . Hx of constipation 10/17/2010  . Hx of menorrhagia 03/2010  . Hypertension   . Inadequate sleep hygiene 08/31/2014  . Migraines   . Morbid obesity due to excess calories ([place]) 05/31/2017  . Obesity   . Obesity, morbid (more than 100 lbs over ideal weight or BMI > 40) ([place]) 2011  . OSA (obstructive sleep apnea) 08/31/2014  . Uterine fibroid 03/2010  . Yeast infection     Past Surgical History:  Procedure Laterality Date  . ANTERIOR CERVICAL DECOMP/DISCECTOMY FUSION N/A 01/23/2018   Procedure: ANTERIOR CERVICAL DECOMPRESSION/DISCECTOMY FUSION CERVICAL THREE- CERVICAL FOUR, CERVICAL FOUR- CERVICAL FIVE;  Surgeon: Consuella Lose, MD;  Location: [facility];  Service: Neurosurgery;  Laterality: N/A;  . BREAST REDUCTION SURGERY  1999  . CARPAL TUNNEL RELEASE    . CESAREAN SECTION    . HYSTEROSCOPY W/ ENDOMETRIAL ABLATION  2011  . KNEE SURGERY Left    fracture  . TONSILLECTOMY  2010  . TUBAL LIGATION  1999     Current Outpatient Medications  Medication Sig Dispense Refill  . albuterol (  PROVENTIL HFA;VENTOLIN HFA) 108 (90 BASE) MCG/ACT inhaler Inhale 2 puffs into the lungs every 6 (six) hours as needed for wheezing. 1 Inhaler 2  . albuterol (PROVENTIL) (2.5 MG/3ML) 0.083% nebulizer solution Take 2.5 mg by nebulization every 6 (six) hours as needed for wheezing or shortness of breath.    Marland Kitchen amLODipine-benazepril (LOTREL) 10-40 MG capsule Take 1 capsule by mouth daily.    Marland Kitchen aspirin EC 81 MG tablet Take 81 mg by mouth daily.    . budesonide-formoterol (SYMBICORT) 160-4.5 MCG/ACT inhaler Inhale 2 puffs into the lungs 2 (two) times daily.    . budesonide-formoterol (SYMBICORT) 80-4.5 MCG/ACT inhaler Inhale 2 puffs into the lungs 2 (two) times daily. 1 Inhaler 11  . buPROPion (WELLBUTRIN XL) 300 MG 24 hr tablet Take 300 mg by mouth daily.   3  . docusate  sodium (COLACE) 100 MG capsule Take 100 mg by mouth daily.    Marland Kitchen FLUoxetine (PROZAC) 20 MG tablet Take 20 mg by mouth daily.    . furosemide (LASIX) 20 MG tablet Take 20 mg by mouth 2 (two) times daily.    Marland Kitchen gabapentin (NEURONTIN) 300 MG capsule Take 300 mg by mouth 2 (two) times daily.     Marland Kitchen HYDROcodone-acetaminophen (NORCO/VICODIN) 5-325 MG tablet Take 1 tablet by mouth every 4 (four) hours as needed for moderate pain ((score 4 to 6)). 30 tablet 0  . levonorgestrel (MIRENA, 52 MG,) 20 MCG/24HR IUD Place 1 Intra Uterine Device vaginally.    Marland Kitchen losartan (COZAAR) 100 MG tablet Take 100 mg by mouth daily.   3  . methocarbamol (ROBAXIN) 500 MG tablet Take 1 tablet (500 mg total) by mouth every 6 (six) hours as needed for muscle spasms. 30 tablet 0  . metoprolol succinate (TOPROL-XL) 25 MG 24 hr tablet Take 25 mg by mouth daily.     . montelukast (SINGULAIR) 10 MG tablet Take 10 mg by mouth at bedtime.    . naproxen (NAPROSYN) 500 MG tablet Take 500 mg by mouth 2 (two) times daily with a meal.    . omeprazole (PRILOSEC) 40 MG capsule Take 40 mg by mouth daily.    . SUMAtriptan (IMITREX) 100 MG tablet Take 100 mg by mouth daily as needed (migraine).     Marland Kitchen tiZANidine (ZANAFLEX) 4 MG capsule Take 4 mg by mouth 3 (three) times daily.    Marland Kitchen topiramate (TOPAMAX) 100 MG tablet Take 100 mg by mouth 2 (two) times daily.    . traMADol (ULTRAM) 50 MG tablet Take 50 mg by mouth 2 (two) times daily.     No current facility-administered medications for this visit.     Allergies:   Other    Social History:  The patient  reports that she has never smoked. She has quit using smokeless tobacco. She reports that she does not drink alcohol or use drugs.   Family History:  The patient's family history includes Cancer in her maternal grandfather; Diabetes in her father; Heart disease in her maternal grandmother.    ROS:  Please see the history of present illness.   Otherwise, review of systems are positive for none.    All other systems are reviewed and negative.    PHYSICAL EXAM: VS:  BP 124/84   Pulse 67   Ht 5\' 9"  (1.753 m)   Wt (!) 353 lb 6.4 oz (160.3 kg)   SpO2 98%   BMI 52.19 kg/m  , BMI Body mass index is 52.19 kg/m. Affect appropriate Obese black  female  HEENT: normal Neck supple with no adenopathy JVP normal no bruits no thyromegaly Lungs clear with no wheezing and good diaphragmatic motion Heart:  S1/S2 no murmur, no rub, gallop or click PMI normal Abdomen: benighn, BS positve, no tenderness, no AAA no bruit.  No HSM or HJR Distal pulses intact with no bruits Plus one LE  edema Neuro non-focal Skin warm and dry No muscular weakness    EKG:  NSR rate 65 totally normal QT 418    Recent Labs: 05/29/2017: Pro B Natriuretic peptide (BNP) 13.0 01/24/2018: BUN 10; Creatinine, Ser 0.93; Hemoglobin 12.6; Platelets 269; Potassium 3.6; Sodium 137    Lipid Panel No results found for: CHOL, TRIG, HDL, CHOLHDL, VLDL, LDLCALC, LDLDIRECT    Wt Readings from Last 3 Encounters:  04/16/18 (!) 353 lb 6.4 oz (160.3 kg)  01/23/18 (!) 350 lb (158.8 kg)  09/25/17 (!) 350 lb (158.8 kg)      Other studies Reviewed: Additional studies/ records that were reviewed today include: Notes from primary ECG and Labs .    ASSESSMENT AND PLAN:  1.  Syncope doubt cardiac etiology with normal exam except for obesity and normal ECG f/u TTE to assess RV/LV size Suspect conversion reaction to stress should see neuro to r/o seizures  2. Obesity major health risk consider bariatric referral    Current medicines are reviewed at length with the patient today.  The patient does not have concerns regarding medicines.  The following changes have been made:  no change  Labs/ tests ordered today include: TTE    Orders Placed This Encounter  Procedures  . ECHOCARDIOGRAM COMPLETE     Disposition:   FU with cardiology PRN      Signed, Jenkins Rouge, MD  04/16/2018 3:20 PM    [facility] [place], [place], [place]  [address] Phone: [phone]; Fax: [phone]

## 2018-04-23 ENCOUNTER — Other Ambulatory Visit (HOSPITAL_COMMUNITY): Payer: Medicare Other

## 2018-04-28 ENCOUNTER — Ambulatory Visit (HOSPITAL_COMMUNITY): Payer: Medicare Other | Attending: Cardiovascular Disease

## 2018-04-28 ENCOUNTER — Other Ambulatory Visit: Payer: Self-pay

## 2018-04-28 DIAGNOSIS — R55 Syncope and collapse: Secondary | ICD-10-CM | POA: Insufficient documentation

## 2018-05-04 ENCOUNTER — Encounter: Payer: Self-pay | Admitting: Physical Therapy

## 2018-05-04 ENCOUNTER — Ambulatory Visit: Payer: Medicare Other | Attending: Family Medicine | Admitting: Physical Therapy

## 2018-05-04 ENCOUNTER — Other Ambulatory Visit: Payer: Self-pay

## 2018-05-04 DIAGNOSIS — M542 Cervicalgia: Secondary | ICD-10-CM | POA: Diagnosis not present

## 2018-05-04 DIAGNOSIS — R293 Abnormal posture: Secondary | ICD-10-CM | POA: Diagnosis present

## 2018-05-04 DIAGNOSIS — M62838 Other muscle spasm: Secondary | ICD-10-CM | POA: Diagnosis present

## 2018-05-04 NOTE — Therapy (Signed)
[place], [place], [address] Phone: [phone]   Fax:  [phone]  Physical Therapy Evaluation  Patient Details  Name: Barbara Richardson MRN: [id number] Date of Birth: 28-Oct-1970 Referring Provider (PT): Ferne Reus PA-c   Encounter Date: 05/04/2018  PT End of Session - 05/04/18 1059    Visit Number  1    Number of Visits  13    Date for PT Re-Evaluation  06/15/18    Authorization Type  MCR: Kx mod by 15th visit, progress note by 10th visit.     PT Start Time  1021    PT Stop Time  1102    PT Time Calculation (min)  41 min    Activity Tolerance  Patient tolerated treatment well;Patient limited by fatigue;Patient limited by pain    Behavior During Therapy  [facility] for tasks assessed/performed       Past Medical History:  Diagnosis Date  . Abnormal Pap smear   . Anxiety   . Asthma   . Atrial fibrillation ([place])   . Bipolar disorder ([place])   . BV (bacterial vaginosis) 08/24/2010  . Cervical radiculopathy 01/23/2018  . Cough variant asthma 10/18/2011   05/29/2017  After extensive coaching HFA effectiveness =    90% > continue symb 160 2bid - Allergy profile 05/29/17  >  Eos 0.6 /  IgE  172  RAST pos mild mold only  - FENO 05/29/2017  =   36 -   08/11/2017  After extensive coaching inhaler device  effectiveness =    75% > continue symicort 160  2bid/ singulair  - PFT's  08/11/2017  FEV1 2.04 (77 % ) ratio 73  p 7 % improvement from saba p symb/saba  . DDD (degenerative disc disease), cervical   . DJD (degenerative joint disease)   . DUB (dysfunctional uterine bleeding) 02/08/2010  . Dyspnea   . Dyspnea on exertion 05/06/2014   Followed in Pulmonary clinic/ [facility]/ [facility] - trial off all acei 05/06/2014  > improved 07/20/14 with nl pfts x low erv > try off symbicort  - 05/29/2017  Walked RA x 3 laps @ 185 ft each stopped due to  End of study, fast pace, leg pain and sob and end with sats down to 90%      . Endometrial polyp 03/2010  . GERD (gastroesophageal reflux disease)   . Hx of chlamydia infection 1995  . Hx of constipation 10/17/2010  . Hx of menorrhagia 03/2010  . Hypertension   . Inadequate sleep hygiene 08/31/2014  . Migraines   . Morbid obesity due to excess calories ([place]) 05/31/2017  . Obesity   . Obesity, morbid (more than 100 lbs over ideal weight or BMI > 40) ([place]) 2011  . OSA (obstructive sleep apnea) 08/31/2014  . Uterine fibroid 03/2010  . Yeast infection     Past Surgical History:  Procedure Laterality Date  . ANTERIOR CERVICAL DECOMP/DISCECTOMY FUSION N/A 01/23/2018   Procedure: ANTERIOR CERVICAL DECOMPRESSION/DISCECTOMY FUSION CERVICAL THREE- CERVICAL FOUR, CERVICAL FOUR- CERVICAL FIVE;  Surgeon: Consuella Lose, MD;  Location: [facility];  Service: Neurosurgery;  Laterality: N/A;  . BREAST REDUCTION SURGERY  1999  . CARPAL TUNNEL RELEASE    . CESAREAN SECTION    . HYSTEROSCOPY W/ ENDOMETRIAL ABLATION  2011  . KNEE SURGERY Left    fracture  . TONSILLECTOMY  2010  . TUBAL LIGATION  1999    There were no vitals filed for this visit.  Subjective Assessment - 05/04/18 1030    Subjective  pt is a 47 y.o F with CC of neck pain. She had a fusion on 01/23/2018. Since surgery she reports having tightness in the neck and limited mobility. she reports pain will radiate to the upper arm and is worse in the R than the L. Since onset the pain seems to be worsening.     Pertinent History  hx or cervical fusion on 01/23/2018    How long can you sit comfortably?  unlimited    How long can you stand comfortably?  unlimited    How long can you walk comfortably?  unlimited    Diagnostic tests  01/23/2018    Patient Stated Goals  to increase motion of the neck, decrease pain, playing with family    Currently in Pain?  Yes    Pain Score  7    at worst 10/10, last took medication prior to PT session   Pain Location  Neck    Pain Orientation  Right;Left;Anterior    Pain Descriptors /  Indicators  Aching;Tightness    Pain Type  Chronic pain;Surgical pain    Pain Radiating Towards  to bil upper humerus    Pain Onset  More than a month ago    Pain Frequency  Constant    Aggravating Factors   laying down and sleeping in th ebed, turning the head    Pain Relieving Factors  medicaiont, muscle rub    Effect of Pain on Daily Activities  limited mobilty          [facility] PT Assessment - 05/04/18 1036      Assessment   Medical Diagnosis  cervical stenosis     Referring Provider (PT)  Ferne Reus PA-c    Onset Date/Surgical Date  01/23/18    Hand Dominance  Right    Next MD Visit  --   couple of montsh   Prior Therapy  yes      Precautions   Precaution Comments  dont lift over 5#      Balance Screen   Has the patient fallen in the past 6 months  Yes    How many times?  2-3    Has the patient had a decrease in activity level because of a fear of falling?   No    Is the patient reluctant to leave their home because of a fear of falling?   No      Home Environment   Living Environment  Private residence    Living Arrangements  Spouse/significant other    Available Help at Discharge  Family;Available PRN/intermittently    Type of Home  House    Home Access  Stairs to enter    Entrance Stairs-Number of Steps  1    Entrance Stairs-Rails  None    Home Layout  Two level    Alternate Level Stairs-Number of Steps  14    Alternate Level Stairs-Rails  Can reach both    [place] - 2 wheels;Cane - single point   C-collar     Prior Function   Level of Independence  Independent with basic ADLs    Vocation  On disability      Cognition   Overall Cognitive Status  Within Functional Limits for tasks assessed      Posture/Postural Control   Posture/Postural Control  Postural limitations    Postural Limitations  Rounded Shoulders;Forward head      ROM /  Strength   AROM / PROM / Strength  AROM;Strength      AROM   AROM Assessment Site  Cervical     Cervical Flexion  20   ERP   Cervical Extension  18   ERP and dizziness noted   Cervical - Right Side Bend  22    Cervical - Left Side Bend  18    Cervical - Right Rotation  31    Cervical - Left Rotation  42      Palpation   Palpation comment  TTP along bil upper trap with significant knot noted in the L upper trap. soreness along bil levator scapuale and sub-occipitals                Objective measurements completed on examination: See above findings.      [facility] Adult PT Treatment/Exercise - 05/04/18 1036      Exercises   Exercises  Neck      Neck Exercises: Seated   Neck Retraction  10 reps;5 secs    Other Seated Exercise  upper cervical rotation with 3 fingers touching chin to fingers  2 x 10    Other Seated Exercise  scapular retraction 1 x 10   verbal cues and demonstration to keep shoulders down     Neck Exercises: Stretches   Upper Trapezius Stretch  2 reps;30 seconds             PT Education - 05/04/18 1059    Education Details  evaluation findings, POC, goals, HEP with proper form/ rationale, anatomy o    Person(s) Educated  Patient;Spouse    Methods  Explanation;Verbal cues;Handout;Demonstration    Comprehension  Verbalized understanding;Verbal cues required;Returned demonstration       PT Short Term Goals - 05/04/18 1242      PT SHORT TERM GOAL #1   Title  pt to be I with initial HEP    Time  3    Period  Weeks    Status  New    Target Date  05/25/18      PT SHORT TERM GOAL #2   Title  pt to verbalize and demo proper posture and lifting mechanics to prevent and reduce neck pain    Time  3    Period  Weeks    Status  New    Target Date  05/25/18      PT SHORT TERM GOAL #3   Title  pt to demo decreased muscle spasm in the upper trap and surrounding musculature to reduce pain to </= 5/10 and promote cervical mobility     Time  3    Period  Weeks    Status  New    Target Date  05/25/18        PT Long Term Goals - 05/04/18 1245       PT LONG TERM GOAL #1   Title  pt to increase cervical flexion and extension by >/= 10 degrees and increase rotation/ sidebending by >/= 10 degrees to promote functional cerivcal mobility with </= 2/10 pain     Time  6    Period  Weeks    Status  New    Target Date  06/15/18      PT LONG TERM GOAL #2   Title  pt to be able to lift and carrying >/ 8# in bil UE reporting no increase in cervical pain for functional lifting    Time  6    Period  Weeks    Status  New    Target Date  06/15/18      PT LONG TERM GOAL #3   Title  Increase FOTO score to </= 52% limited to demo improvement of function    Time  6    Period  Weeks    Status  New    Target Date  06/15/18      PT LONG TERM GOAL #4   Title  pt to be I with all HEP given to maintain and progress current level of function    Time  6    Period  Weeks    Status  New    Target Date  06/15/18             Plan - 05/04/18 1101    Clinical Impression Statement  pt is a 47 y.o F presenting to OPPT with CC of neck pain following cervical fusion on 01/23/2018. she exhibits significant limitat cervical mobility in all planes and reports increased dizziness with cerivcal exentension. TTP in bil upper traps/ levator scapulae and surrouding musculature. she would benefit from physical therapy to decrease muscle tightness, improve cervical mobility and functional posture and maximize her rfunciton by addressing the deficits listed.     History and Personal Factors relevant to plan of care:  hx of cervical fusion with worsening pain, hx of anxiety, obesity    Clinical Presentation  Unstable    Clinical Presentation due to:  limited cervical mobility, worsening pain since surgery, musle spasm, abnormal posture    Clinical Decision Making  High    Rehab Potential  Good    PT Frequency  2x / week    PT Duration  6 weeks    PT Treatment/Interventions  ADLs/Self Care Home Management;Cryotherapy;Electrical Stimulation;Iontophoresis 4mg /ml  Dexamethasone;Moist Heat;Ultrasound;Balance training;Neuromuscular re-education;Patient/family education;Stair training;Gait training;Therapeutic exercise;Manual techniques;Taping;Passive range of motion;Dry needling    PT Next Visit Plan  review/ update HEP, cervical mobs, PROM, STW along upper trap/ levator scap, modalities PRN, check BP due to dizziness    PT Home Exercise Plan  upper trap stretching, upper cervical rotation, chin tuck , scapular retraction    Consulted and Agree with Plan of Care  Patient       Patient will benefit from skilled therapeutic intervention in order to improve the following deficits and impairments:  Abnormal gait, Pain, Decreased strength, Improper body mechanics, Postural dysfunction, Decreased endurance, Decreased activity tolerance, Decreased mobility, Increased fascial restricitons, Increased muscle spasms, Decreased range of motion  Visit Diagnosis: Cervicalgia  Other muscle spasm  Abnormal posture     Problem List Patient Active Problem List   Diagnosis Date Noted  . Cervical radiculopathy 01/23/2018  . Morbid obesity due to excess calories ([place]) 05/31/2017  . OSA (obstructive sleep apnea) 08/31/2014  . Inadequate sleep hygiene 08/31/2014  . Excessive daytime sleepiness 05/23/2014  . Dyspnea on exertion 05/06/2014  . Essential hypertension 10/18/2011  . Cough variant asthma 10/18/2011  . GERD (gastroesophageal reflux disease) 10/18/2011   Starr Lake PT, DPT, LAT, ATC  05/04/18  12:50 PM      [facility] [address] [place], [place], [address] Phone: [phone]   Fax:  [phone]  Name: Aliviana Burdell MRN: [id number] Date of Birth: 1971-03-18

## 2018-05-08 ENCOUNTER — Encounter

## 2018-05-12 ENCOUNTER — Ambulatory Visit: Payer: Medicare Other | Admitting: Physical Therapy

## 2018-05-13 ENCOUNTER — Ambulatory Visit: Payer: Medicare Other

## 2018-05-15 ENCOUNTER — Ambulatory Visit: Payer: Medicare Other | Admitting: Physical Therapy

## 2018-05-15 ENCOUNTER — Telehealth: Payer: Self-pay | Admitting: Physical Therapy

## 2018-05-15 NOTE — Telephone Encounter (Signed)
Attempted phone call to pt due to missed PT therapy appointment on 05/15/18 at 9:30am. Pt's mailbox was full and I was unable to leave a message.    Kearney Hard, PT 05/15/18 10:02 AM

## 2018-05-18 ENCOUNTER — Encounter: Payer: Self-pay | Admitting: Physical Therapy

## 2018-05-18 ENCOUNTER — Ambulatory Visit: Payer: Medicare Other | Admitting: Physical Therapy

## 2018-05-18 DIAGNOSIS — M542 Cervicalgia: Secondary | ICD-10-CM | POA: Diagnosis not present

## 2018-05-18 DIAGNOSIS — R293 Abnormal posture: Secondary | ICD-10-CM

## 2018-05-18 DIAGNOSIS — M62838 Other muscle spasm: Secondary | ICD-10-CM

## 2018-05-18 NOTE — Therapy (Signed)
[facility] [place], [place], [address] Phone: [phone]   Fax:  [phone]  Physical Therapy Treatment  Patient Details  Name: Barbara Richardson MRN: [id number] Date of Birth: 06-20-1971 Referring Provider (PT): Ferne Reus PA-c   Encounter Date: 05/18/2018  PT End of Session - 05/18/18 1052    Visit Number  2    Number of Visits  13    Date for PT Re-Evaluation  06/15/18    Authorization Type  MCR: Kx mod by 15th visit, progress note by 10th visit.     PT Start Time  1018    PT Stop Time  1100    PT Time Calculation (min)  42 min    Activity Tolerance  Patient tolerated treatment well;Patient limited by pain    Behavior During Therapy  WFL for tasks assessed/performed       Past Medical History:  Diagnosis Date  . Abnormal Pap smear   . Anxiety   . Asthma   . Atrial fibrillation ([place])   . Bipolar disorder ([place])   . BV (bacterial vaginosis) 08/24/2010  . Cervical radiculopathy 01/23/2018  . Cough variant asthma 10/18/2011   05/29/2017  After extensive coaching HFA effectiveness =    90% > continue symb 160 2bid - Allergy profile 05/29/17  >  Eos 0.6 /  IgE  172  RAST pos mild mold only  - FENO 05/29/2017  =   36 -   08/11/2017  After extensive coaching inhaler device  effectiveness =    75% > continue symicort 160  2bid/ singulair  - PFT's  08/11/2017  FEV1 2.04 (77 % ) ratio 73  p 7 % improvement from saba p symb/saba  . DDD (degenerative disc disease), cervical   . DJD (degenerative joint disease)   . DUB (dysfunctional uterine bleeding) 02/08/2010  . Dyspnea   . Dyspnea on exertion 05/06/2014   Followed in Pulmonary clinic/ [facility]/ [place] - trial off all acei 05/06/2014  > improved 07/20/14 with nl pfts x low erv > try off symbicort  - 05/29/2017  Walked RA x 3 laps @ 185 ft each stopped due to  End of study, fast pace, leg pain and sob and end with sats down to 90%     . Endometrial polyp 03/2010   . GERD (gastroesophageal reflux disease)   . Hx of chlamydia infection 1995  . Hx of constipation 10/17/2010  . Hx of menorrhagia 03/2010  . Hypertension   . Inadequate sleep hygiene 08/31/2014  . Migraines   . Morbid obesity due to excess calories ([place]) 05/31/2017  . Obesity   . Obesity, morbid (more than 100 lbs over ideal weight or BMI > 40) ([place]) 2011  . OSA (obstructive sleep apnea) 08/31/2014  . Uterine fibroid 03/2010  . Yeast infection     Past Surgical History:  Procedure Laterality Date  . ANTERIOR CERVICAL DECOMP/DISCECTOMY FUSION N/A 01/23/2018   Procedure: ANTERIOR CERVICAL DECOMPRESSION/DISCECTOMY FUSION CERVICAL THREE- CERVICAL FOUR, CERVICAL FOUR- CERVICAL FIVE;  Surgeon: Consuella Lose, MD;  Location: [facility];  Service: Neurosurgery;  Laterality: N/A;  . BREAST REDUCTION SURGERY  1999  . CARPAL TUNNEL RELEASE    . CESAREAN SECTION    . HYSTEROSCOPY W/ ENDOMETRIAL ABLATION  2011  . KNEE SURGERY Left    fracture  . TONSILLECTOMY  2010  . TUBAL LIGATION  1999    There were no vitals filed for this visit.  Subjective Assessment - 05/18/18  1021    Subjective  " I am not sure how my neck is doing becuase my knee is really acting, I am going to see the orthopedic MD tomorrow about potentially getting a knee replacement" pt reports she does the exercise when she remembers doing them about 1-2 /10.    Patient Stated Goals  to increase motion of the neck, decrease pain, playing with family    Currently in Pain?  Yes    Pain Score  6     Pain Location  Neck    Pain Orientation  Right;Left    Pain Descriptors / Indicators  Aching;Sore;Tightness    Pain Onset  More than a month ago    Pain Frequency  Intermittent    Multiple Pain Sites  Yes    Pain Score  10    Pain Location  Knee    Pain Orientation  Left    Pain Type  Chronic pain    Pain Onset  More than a month ago    Pain Frequency  Constant    Aggravating Factors   standing/ walking, any knee movement                        [facility] Adult PT Treatment/Exercise - 05/18/18 0001      Self-Care   Self-Care  Other Self-Care Comments    Other Self-Care Comments   how to perform sub-occipital release at home      Exercises   Exercises  Neck      Neck Exercises: Supine   Other Supine Exercise  bil shoulder protraction 2 x 10   tactile cues for proper height   Other Supine Exercise  horizontal abduction 2 x 10 with red theraband      Manual Therapy   Manual Therapy  Myofascial release;Passive ROM    Manual therapy comments  manual trigger point release along R upper trap and leavator scapulae    Myofascial Release  sub-occipital release     Passive ROM  cervical rotation working into end ranges with oscillations   cues to relax to reduce guarding            PT Education - 05/18/18 1057    Education Details  reviewed previusly provided HEP    Person(s) Educated  Patient;Spouse    Methods  Explanation;Verbal cues    Comprehension  Verbalized understanding;Verbal cues required       PT Short Term Goals - 05/04/18 1242      PT SHORT TERM GOAL #1   Title  pt to be I with initial HEP    Time  3    Period  Weeks    Status  New    Target Date  05/25/18      PT SHORT TERM GOAL #2   Title  pt to verbalize and demo proper posture and lifting mechanics to prevent and reduce neck pain    Time  3    Period  Weeks    Status  New    Target Date  05/25/18      PT SHORT TERM GOAL #3   Title  pt to demo decreased muscle spasm in the upper trap and surrounding musculature to reduce pain to </= 5/10 and promote cervical mobility     Time  3    Period  Weeks    Status  New    Target Date  05/25/18  PT Long Term Goals - 05/04/18 1245      PT LONG TERM GOAL #1   Title  pt to increase cervical flexion and extension by >/= 10 degrees and increase rotation/ sidebending by >/= 10 degrees to promote functional cerivcal mobility with </= 2/10 pain     Time  6     Period  Weeks    Status  New    Target Date  06/15/18      PT LONG TERM GOAL #2   Title  pt to be able to lift and carrying >/ 8# in bil UE reporting no increase in cervical pain for functional lifting    Time  6    Period  Weeks    Status  New    Target Date  06/15/18      PT LONG TERM GOAL #3   Title  Increase FOTO score to </= 52% limited to demo improvement of function    Time  6    Period  Weeks    Status  New    Target Date  06/15/18      PT LONG TERM GOAL #4   Title  pt to be I with all HEP given to maintain and progress current level of function    Time  6    Period  Weeks    Status  New    Target Date  06/15/18            Plan - 05/18/18 1053    Clinical Impression Statement  pt presents today reporting increased pain in the L knee limited mobility. pt reports consistency with her HEP and states she feels it is helping. reviewed HEP which she performed with minimal cues for form. STW and mobs to improve mobility pt guard throughout session.     PT Treatment/Interventions  ADLs/Self Care Home Management;Cryotherapy;Electrical Stimulation;Iontophoresis 4mg /ml Dexamethasone;Moist Heat;Ultrasound;Balance training;Neuromuscular re-education;Patient/family education;Stair training;Gait training;Therapeutic exercise;Manual techniques;Taping;Passive range of motion;Dry needling    PT Next Visit Plan  update HEP, cervical mobs, PROM, STW along upper trap/ levator scap, modalities PRN, check BP PRN for dizziness    PT Home Exercise Plan  upper trap stretching, upper cervical rotation, chin tuck , scapular retraction    Consulted and Agree with Plan of Care  Patient       Patient will benefit from skilled therapeutic intervention in order to improve the following deficits and impairments:  Abnormal gait, Pain, Decreased strength, Improper body mechanics, Postural dysfunction, Decreased endurance, Decreased activity tolerance, Decreased mobility, Increased fascial restricitons,  Increased muscle spasms, Decreased range of motion  Visit Diagnosis: Cervicalgia  Other muscle spasm  Abnormal posture     Problem List Patient Active Problem List   Diagnosis Date Noted  . Cervical radiculopathy 01/23/2018  . Morbid obesity due to excess calories ([place]) 05/31/2017  . OSA (obstructive sleep apnea) 08/31/2014  . Inadequate sleep hygiene 08/31/2014  . Excessive daytime sleepiness 05/23/2014  . Dyspnea on exertion 05/06/2014  . Essential hypertension 10/18/2011  . Cough variant asthma 10/18/2011  . GERD (gastroesophageal reflux disease) 10/18/2011   Starr Lake PT, DPT, LAT, ATC  05/18/18  11:04 AM      [facility] [address] [place], [place], [address] Phone: [phone]   Fax:  [phone]  Name: Barbara Richardson MRN: [id number] Date of Birth: [birth year]

## 2018-05-20 ENCOUNTER — Ambulatory Visit: Payer: Medicare Other | Admitting: Physical Therapy

## 2018-05-25 ENCOUNTER — Telehealth: Payer: Self-pay | Admitting: Physical Therapy

## 2018-05-25 ENCOUNTER — Ambulatory Visit: Payer: Medicare Other | Admitting: Physical Therapy

## 2018-05-25 NOTE — Telephone Encounter (Signed)
Spoke with patient about today's missed appointment and noted the day and time of her next scheduled visit. Also that today was her 2nd missed appointment and per clinic policy we are required to cancel future visits except for the next visit and she can schedule one at a time. If she is unable to make the next visit to call and cancel/reschedule her appointment to prevent no-show because 3 missed visits is grounds for discharge. Barbara Richardson stated she understood and would let us know if she couldn't make the next appointment.

## 2018-05-27 ENCOUNTER — Encounter: Payer: Self-pay | Admitting: Physical Therapy

## 2018-05-27 ENCOUNTER — Ambulatory Visit: Payer: Medicare Other | Admitting: Physical Therapy

## 2018-05-27 DIAGNOSIS — R293 Abnormal posture: Secondary | ICD-10-CM

## 2018-05-27 DIAGNOSIS — M542 Cervicalgia: Secondary | ICD-10-CM | POA: Diagnosis not present

## 2018-05-27 DIAGNOSIS — M62838 Other muscle spasm: Secondary | ICD-10-CM

## 2018-05-27 NOTE — Therapy (Signed)
[facility], [place], [address] Phone: [phone]   Fax:  [phone]  Physical Therapy Treatment  Patient Details  Name: Barbara Richardson MRN: [id number] Date of Birth: 05-14-1971 Referring Provider (PT): Ferne Reus PA-c   Encounter Date: 05/27/2018  PT End of Session - 05/27/18 0905    Visit Number  3    Number of Visits  13    Date for PT Re-Evaluation  06/15/18    Authorization Type  MCR: Kx mod by 15th visit, progress note by 10th visit.     PT Start Time  [phone]    PT Stop Time  0922    PT Time Calculation (min)  38 min    Activity Tolerance  Patient tolerated treatment well    Behavior During Therapy  [facility] for tasks assessed/performed       Past Medical History:  Diagnosis Date  . Abnormal Pap smear   . Anxiety   . Asthma   . Atrial fibrillation ([place])   . Bipolar disorder ([place])   . BV (bacterial vaginosis) 08/24/2010  . Cervical radiculopathy 01/23/2018  . Cough variant asthma 10/18/2011   05/29/2017  After extensive coaching HFA effectiveness =    90% > continue symb 160 2bid - Allergy profile 05/29/17  >  Eos 0.6 /  IgE  172  RAST pos mild mold only  - FENO 05/29/2017  =   36 -   08/11/2017  After extensive coaching inhaler device  effectiveness =    75% > continue symicort 160  2bid/ singulair  - PFT's  08/11/2017  FEV1 2.04 (77 % ) ratio 73  p 7 % improvement from saba p symb/saba  . DDD (degenerative disc disease), cervical   . DJD (degenerative joint disease)   . DUB (dysfunctional uterine bleeding) 02/08/2010  . Dyspnea   . Dyspnea on exertion 05/06/2014   Followed in Pulmonary clinic/ [facility]/ [place] - trial off all acei 05/06/2014  > improved 07/20/14 with nl pfts x low erv > try off symbicort  - 05/29/2017  Walked RA x 3 laps @ 185 ft each stopped due to  End of study, fast pace, leg pain and sob and end with sats down to 90%     . Endometrial polyp 03/2010  . GERD  (gastroesophageal reflux disease)   . Hx of chlamydia infection 1995  . Hx of constipation 10/17/2010  . Hx of menorrhagia 03/2010  . Hypertension   . Inadequate sleep hygiene 08/31/2014  . Migraines   . Morbid obesity due to excess calories ([place]) 05/31/2017  . Obesity   . Obesity, morbid (more than 100 lbs over ideal weight or BMI > 40) ([place]) 2011  . OSA (obstructive sleep apnea) 08/31/2014  . Uterine fibroid 03/2010  . Yeast infection     Past Surgical History:  Procedure Laterality Date  . ANTERIOR CERVICAL DECOMP/DISCECTOMY FUSION N/A 01/23/2018   Procedure: ANTERIOR CERVICAL DECOMPRESSION/DISCECTOMY FUSION CERVICAL THREE- CERVICAL FOUR, CERVICAL FOUR- CERVICAL FIVE;  Surgeon: Consuella Lose, MD;  Location: [facility];  Service: Neurosurgery;  Laterality: N/A;  . BREAST REDUCTION SURGERY  1999  . CARPAL TUNNEL RELEASE    . CESAREAN SECTION    . HYSTEROSCOPY W/ ENDOMETRIAL ABLATION  2011  . KNEE SURGERY Left    fracture  . TONSILLECTOMY  2010  . TUBAL LIGATION  1999    There were no vitals filed for this visit.  Subjective Assessment - 05/27/18 0846  Subjective  "I am doing much better, I did get an injection in the knee and my knee is doing much better. I have been doing my exercises and it helps"    Patient Stated Goals  to increase motion of the neck, decrease pain, playing with family    Currently in Pain?  Yes    Pain Score  4     Pain Location  Neck    Pain Orientation  Right;Left    Pain Descriptors / Indicators  Aching;Sore    Pain Onset  More than a month ago    Pain Frequency  Intermittent    Aggravating Factors   laying down, doing too much    Pain Relieving Factors  medication,                        [facility] Adult PT Treatment/Exercise - 05/27/18 0851      Self-Care   Other Self-Care Comments   how to perform MTPR along the upper traps and tools that can assist with releasing the muscle      Neck Exercises: Standing   Other Standing Exercises   scaption 2 x 10 1 #      Neck Exercises: Seated   Neck Retraction  10 reps;5 secs    Other Seated Exercise  horizontal abduction with red theraband 2 x 10     Other Seated Exercise  rows 2 x 10 with red theraband, scapular retraction with ER 2 x 15 with green theraband      Manual Therapy   Manual therapy comments  manual trigger point release along the upper trap      Neck Exercises: Stretches   Upper Trapezius Stretch  2 reps;30 seconds             PT Education - 05/27/18 0921    Education Details  MTPR techniques and tools to assist with technique. discussed POC see pt potentially for 1 more visit if she continues to do well then possibly d/c    Person(s) Educated  Patient;Spouse    Methods  Explanation;Verbal cues    Comprehension  Verbalized understanding;Verbal cues required       PT Short Term Goals - 05/04/18 1242      PT SHORT TERM GOAL #1   Title  pt to be I with initial HEP    Time  3    Period  Weeks    Status  New    Target Date  05/25/18      PT SHORT TERM GOAL #2   Title  pt to verbalize and demo proper posture and lifting mechanics to prevent and reduce neck pain    Time  3    Period  Weeks    Status  New    Target Date  05/25/18      PT SHORT TERM GOAL #3   Title  pt to demo decreased muscle spasm in the upper trap and surrounding musculature to reduce pain to </= 5/10 and promote cervical mobility     Time  3    Period  Weeks    Status  New    Target Date  05/25/18        PT Long Term Goals - 05/04/18 1245      PT LONG TERM GOAL #1   Title  pt to increase cervical flexion and extension by >/= 10 degrees and increase rotation/ sidebending by >/= 10 degrees to promote functional  cerivcal mobility with </= 2/10 pain     Time  6    Period  Weeks    Status  New    Target Date  06/15/18      PT LONG TERM GOAL #2   Title  pt to be able to lift and carrying >/ 8# in bil UE reporting no increase in cervical pain for functional lifting    Time   6    Period  Weeks    Status  New    Target Date  06/15/18      PT LONG TERM GOAL #3   Title  Increase FOTO score to </= 52% limited to demo improvement of function    Time  6    Period  Weeks    Status  New    Target Date  06/15/18      PT LONG TERM GOAL #4   Title  pt to be I with all HEP given to maintain and progress current level of function    Time  6    Period  Weeks    Status  New    Target Date  06/15/18            Plan - 05/27/18 0905    Clinical Impression Statement  pt reports improvement of pain int he neck and knees today. continued MTPR along bil upper trap and taught how to perform at home with tools. focused on shoulder strengthening to promote support and stability. end of session she reported no increase in pain.    PT Next Visit Plan  update HEP, cervical mobs, PROM, STW along upper trap/ levator scap, modalities PRN, check BP PRN for dizziness    PT Home Exercise Plan  upper trap stretching, upper cervical rotation, chin tuck , scapular retraction    Consulted and Agree with Plan of Care  Patient       Patient will benefit from skilled therapeutic intervention in order to improve the following deficits and impairments:  Abnormal gait, Pain, Decreased strength, Improper body mechanics, Postural dysfunction, Decreased endurance, Decreased activity tolerance, Decreased mobility, Increased fascial restricitons, Increased muscle spasms, Decreased range of motion  Visit Diagnosis: Cervicalgia  Other muscle spasm  Abnormal posture     Problem List Patient Active Problem List   Diagnosis Date Noted  . Cervical radiculopathy 01/23/2018  . Morbid obesity due to excess calories ([place]) 05/31/2017  . OSA (obstructive sleep apnea) 08/31/2014  . Inadequate sleep hygiene 08/31/2014  . Excessive daytime sleepiness 05/23/2014  . Dyspnea on exertion 05/06/2014  . Essential hypertension 10/18/2011  . Cough variant asthma 10/18/2011  . GERD (gastroesophageal  reflux disease) 10/18/2011   Starr Lake PT, DPT, LAT, ATC  05/27/18  9:34 AM      [facility] [address] [place], [place], [address] Phone: [phone]   Fax:  [phone]  Name: Barbara Richardson MRN: [id number] Date of Birth: 19-Oct-1970

## 2018-06-03 ENCOUNTER — Encounter: Payer: Medicare Other | Admitting: Physical Therapy

## 2018-06-05 ENCOUNTER — Encounter: Payer: Medicare Other | Admitting: Physical Therapy

## 2018-06-09 ENCOUNTER — Ambulatory Visit: Payer: Medicare Other | Attending: Family Medicine | Admitting: Physical Therapy

## 2018-06-09 ENCOUNTER — Encounter: Payer: Self-pay | Admitting: Physical Therapy

## 2018-06-09 DIAGNOSIS — R293 Abnormal posture: Secondary | ICD-10-CM | POA: Diagnosis present

## 2018-06-09 DIAGNOSIS — M542 Cervicalgia: Secondary | ICD-10-CM | POA: Diagnosis not present

## 2018-06-09 DIAGNOSIS — M62838 Other muscle spasm: Secondary | ICD-10-CM | POA: Insufficient documentation

## 2018-06-09 NOTE — Therapy (Signed)
[place], [place], [address] Phone: [phone]   Fax:  [phone]  Physical Therapy Treatment / Discharge Summary   Patient Details  Name: Barbara Richardson MRN: [id number] Date of Birth: July 21, 1970 Referring Provider (PT): Ferne Reus PA-c   Encounter Date: 06/09/2018  PT End of Session - 06/09/18 1450    Visit Number  4    Number of Visits  13    Date for PT Re-Evaluation  06/15/18    Authorization Type  MCR: Kx mod by 15th visit, progress note by 10th visit.     PT Start Time  1442    PT Stop Time  1520    PT Time Calculation (min)  38 min    Activity Tolerance  Patient tolerated treatment well    Behavior During Therapy  WFL for tasks assessed/performed       Past Medical History:  Diagnosis Date  . Abnormal Pap smear   . Anxiety   . Asthma   . Atrial fibrillation ([place])   . Bipolar disorder ([place])   . BV (bacterial vaginosis) 08/24/2010  . Cervical radiculopathy 01/23/2018  . Cough variant asthma 10/18/2011   05/29/2017  After extensive coaching HFA effectiveness =    90% > continue symb 160 2bid - Allergy profile 05/29/17  >  Eos 0.6 /  IgE  172  RAST pos mild mold only  - FENO 05/29/2017  =   36 -   08/11/2017  After extensive coaching inhaler device  effectiveness =    75% > continue symicort 160  2bid/ singulair  - PFT's  08/11/2017  FEV1 2.04 (77 % ) ratio 73  p 7 % improvement from saba p symb/saba  . DDD (degenerative disc disease), cervical   . DJD (degenerative joint disease)   . DUB (dysfunctional uterine bleeding) 02/08/2010  . Dyspnea   . Dyspnea on exertion 05/06/2014   Followed in Pulmonary clinic/ [facility]/ [place] - trial off all acei 05/06/2014  > improved 07/20/14 with nl pfts x low erv > try off symbicort  - 05/29/2017  Walked RA x 3 laps @ 185 ft each stopped due to  End of study, fast pace, leg pain and sob and end with sats down to 90%     . Endometrial polyp 03/2010   . GERD (gastroesophageal reflux disease)   . Hx of chlamydia infection 1995  . Hx of constipation 10/17/2010  . Hx of menorrhagia 03/2010  . Hypertension   . Inadequate sleep hygiene 08/31/2014  . Migraines   . Morbid obesity due to excess calories ([place]) 05/31/2017  . Obesity   . Obesity, morbid (more than 100 lbs over ideal weight or BMI > 40) ([place]) 2011  . OSA (obstructive sleep apnea) 08/31/2014  . Uterine fibroid 03/2010  . Yeast infection     Past Surgical History:  Procedure Laterality Date  . ANTERIOR CERVICAL DECOMP/DISCECTOMY FUSION N/A 01/23/2018   Procedure: ANTERIOR CERVICAL DECOMPRESSION/DISCECTOMY FUSION CERVICAL THREE- CERVICAL FOUR, CERVICAL FOUR- CERVICAL FIVE;  Surgeon: Consuella Lose, MD;  Location: [facility];  Service: Neurosurgery;  Laterality: N/A;  . BREAST REDUCTION SURGERY  1999  . CARPAL TUNNEL RELEASE    . CESAREAN SECTION    . HYSTEROSCOPY W/ ENDOMETRIAL ABLATION  2011  . KNEE SURGERY Left    fracture  . TONSILLECTOMY  2010  . TUBAL LIGATION  1999    There were no vitals filed for this visit.  Subjective Assessment -  06/09/18 1451    Subjective  "I am doing pretty today"     Patient Stated Goals  to increase motion of the neck, decrease pain, playing with family    Currently in Pain?  No/denies         [facility] PT Assessment - 06/09/18 1500      Observation/Other Assessments   Focus on Therapeutic Outcomes (FOTO)   33% limited      AROM   Cervical Flexion  51   inintallly 20 degrees   Cervical Extension  48   initally 18 degrees   Cervical - Right Side Bend  51   initially 22 degrres   Cervical - Left Side Bend  50   initally 50 degrees   Cervical - Right Rotation  70   initally 31 degrees   Cervical - Left Rotation  68   initally 42 degrees                  [facility] Adult PT Treatment/Exercise - 06/09/18 0001      Exercises   Exercises  Neck;Shoulder      Neck Exercises: Seated   Neck Retraction  10 reps;5 secs   x 2sets  with green theraband   Other Seated Exercise  rows 2 x 10 with blue  theraband, scapular retraction with ER 2 x 15 with green theraband      Shoulder Exercises: Seated   Other Seated Exercises  bicep curl 2 x 10 with blue band    Other Seated Exercises  serratus press with blue theraband 2 x 10      Manual Therapy   Manual therapy comments  manual trigger point release along the upper trap      Neck Exercises: Stretches   Upper Trapezius Stretch  2 reps;30 seconds               PT Short Term Goals - 06/09/18 1503      PT SHORT TERM GOAL #1   Title  pt to be I with initial HEP    Status  Achieved      PT SHORT TERM GOAL #2   Title  pt to verbalize and demo proper posture and lifting mechanics to prevent and reduce neck pain    Time  3    Period  Weeks    Status  Achieved      PT SHORT TERM GOAL #3   Title  pt to demo decreased muscle spasm in the upper trap and surrounding musculature to reduce pain to </= 5/10 and promote cervical mobility     Period  Weeks    Status  Achieved        PT Long Term Goals - 06/09/18 1504      PT LONG TERM GOAL #1   Title  pt to increase cervical flexion and extension by >/= 10 degrees and increase rotation/ sidebending by >/= 10 degrees to promote functional cerivcal mobility with </= 2/10 pain     Period  Weeks    Status  Achieved      PT LONG TERM GOAL #2   Title  pt to be able to lift and carrying >/ 8# in bil UE reporting no increase in cervical pain for functional lifting    Time  6    Period  Weeks    Status  Achieved      PT LONG TERM GOAL #3   Title  Increase FOTO score to </= 52%  limited to demo improvement of function    Period  Weeks    Status  Achieved      PT LONG TERM GOAL #4   Title  pt to be I with all HEP given to maintain and progress current level of function    Time  6            Plan - 06/09/18 1521    Clinical Impression Statement  Barbara Richardson has made great progress with physical therapy  improving neck mobility, and additionally reports no pain. she was able to do all exercises with no report of pain or discomfort. she met all goals and is able to maintain current level of function independently and will be discharged from PT.     PT Treatment/Interventions  ADLs/Self Care Home Management;Cryotherapy;Electrical Stimulation;Iontophoresis 47m/ml Dexamethasone;Moist Heat;Ultrasound;Balance training;Neuromuscular re-education;Patient/family education;Stair training;Gait training;Therapeutic exercise;Manual techniques;Taping;Passive range of motion;Dry needling    PT Next Visit Plan  D/C    PT Home Exercise Plan  upper trap stretching, upper cervical rotation, chin tuck , scapular retraction    Consulted and Agree with Plan of Care  Patient       Patient will benefit from skilled therapeutic intervention in order to improve the following deficits and impairments:  Abnormal gait, Pain, Decreased strength, Improper body mechanics, Postural dysfunction, Decreased endurance, Decreased activity tolerance, Decreased mobility, Increased fascial restricitons, Increased muscle spasms, Decreased range of motion  Visit Diagnosis: Cervicalgia  Other muscle spasm  Abnormal posture     Problem List Patient Active Problem List   Diagnosis Date Noted  . Cervical radiculopathy 01/23/2018  . Morbid obesity due to excess calories ([place] 05/31/2017  . OSA (obstructive sleep apnea) 08/31/2014  . Inadequate sleep hygiene 08/31/2014  . Excessive daytime sleepiness 05/23/2014  . Dyspnea on exertion 05/06/2014  . Essential hypertension 10/18/2011  . Cough variant asthma 10/18/2011  . GERD (gastroesophageal reflux disease) 10/18/2011    L[place]arr Lake12/03/2018, 3:27 PM  [facility] [address]Phone: [phone]  Fax:  [phone] Name: APaolina KarwowskiMRN: [id number] of Birth:  108-25-1972    PHYSICAL THERAPY DISCHARGE SUMMARY  Visits from Start of Care: 4  Current functional level related to goals / functional outcomes: See goals, FOTO 33% limited   Remaining deficits: N/A   Education / Equipment: HEP, theraband, posture,   Plan: Patient agrees to discharge.  Patient goals were met. Patient is being discharged due to being pleased with the current functional level.  ?????         Jeremia Groot PT, DPT, LAT, ATC  06/09/18  3:27 PM

## 2018-07-14 DIAGNOSIS — N939 Abnormal uterine and vaginal bleeding, unspecified: Secondary | ICD-10-CM | POA: Diagnosis not present

## 2018-07-27 DIAGNOSIS — I10 Essential (primary) hypertension: Secondary | ICD-10-CM | POA: Diagnosis not present

## 2018-07-27 DIAGNOSIS — Z6838 Body mass index (BMI) 38.0-38.9, adult: Secondary | ICD-10-CM | POA: Diagnosis not present

## 2018-07-27 DIAGNOSIS — M4802 Spinal stenosis, cervical region: Secondary | ICD-10-CM | POA: Diagnosis not present

## 2018-08-18 ENCOUNTER — Emergency Department (HOSPITAL_COMMUNITY): Payer: Medicare HMO

## 2018-08-18 ENCOUNTER — Encounter (HOSPITAL_COMMUNITY): Payer: Self-pay | Admitting: Emergency Medicine

## 2018-08-18 ENCOUNTER — Emergency Department (HOSPITAL_COMMUNITY)
Admission: EM | Admit: 2018-08-18 | Discharge: 2018-08-18 | Disposition: A | Discharge status: Home / Self Care | Payer: Medicare HMO | Attending: Emergency Medicine | Admitting: Emergency Medicine

## 2018-08-18 DIAGNOSIS — H66001 Acute suppurative otitis media without spontaneous rupture of ear drum, right ear: Secondary | ICD-10-CM | POA: Diagnosis not present

## 2018-08-18 DIAGNOSIS — I10 Essential (primary) hypertension: Secondary | ICD-10-CM | POA: Diagnosis not present

## 2018-08-18 DIAGNOSIS — Z79899 Other long term (current) drug therapy: Secondary | ICD-10-CM | POA: Diagnosis not present

## 2018-08-18 DIAGNOSIS — Z7982 Long term (current) use of aspirin: Secondary | ICD-10-CM | POA: Diagnosis not present

## 2018-08-18 DIAGNOSIS — J0110 Acute frontal sinusitis, unspecified: Secondary | ICD-10-CM | POA: Insufficient documentation

## 2018-08-18 DIAGNOSIS — R1031 Right lower quadrant pain: Secondary | ICD-10-CM | POA: Diagnosis not present

## 2018-08-18 DIAGNOSIS — J45909 Unspecified asthma, uncomplicated: Secondary | ICD-10-CM | POA: Insufficient documentation

## 2018-08-18 DIAGNOSIS — H9201 Otalgia, right ear: Secondary | ICD-10-CM | POA: Diagnosis present

## 2018-08-18 DIAGNOSIS — R42 Dizziness and giddiness: Secondary | ICD-10-CM | POA: Diagnosis not present

## 2018-08-18 DIAGNOSIS — R5381 Other malaise: Secondary | ICD-10-CM | POA: Diagnosis not present

## 2018-08-18 LAB — CBC WITH DIFFERENTIAL/PLATELET
Abs Immature Granulocytes: 0.02 10*3/uL (ref 0.00–0.07)
Basophils Absolute: 0 10*3/uL (ref 0.0–0.1)
Basophils Relative: 1 %
Eosinophils Absolute: 0.5 10*3/uL (ref 0.0–0.5)
Eosinophils Relative: 7 %
HCT: 44 % (ref 36.0–46.0)
Hemoglobin: 14 g/dL (ref 12.0–15.0)
Immature Granulocytes: 0 %
Lymphocytes Relative: 25 %
Lymphs Abs: 1.6 10*3/uL (ref 0.7–4.0)
MCH: 29.6 pg (ref 26.0–34.0)
MCHC: 31.8 g/dL (ref 30.0–36.0)
MCV: 93 fL (ref 80.0–100.0)
Monocytes Absolute: 0.7 10*3/uL (ref 0.1–1.0)
Monocytes Relative: 10 %
Neutro Abs: 3.8 10*3/uL (ref 1.7–7.7)
Neutrophils Relative %: 57 %
Platelets: 273 10*3/uL (ref 150–400)
RBC: 4.73 MIL/uL (ref 3.87–5.11)
RDW: 13.6 % (ref 11.5–15.5)
WBC: 6.6 10*3/uL (ref 4.0–10.5)
nRBC: 0 % (ref 0.0–0.2)

## 2018-08-18 LAB — BASIC METABOLIC PANEL
Anion gap: 11 (ref 5–15)
BUN: 11 mg/dL (ref 6–20)
CO2: 21 mmol/L — ABNORMAL LOW (ref 22–32)
Calcium: 9.1 mg/dL (ref 8.9–10.3)
Chloride: 105 mmol/L (ref 98–111)
Creatinine, Ser: 0.91 mg/dL (ref 0.44–1.00)
GFR calc Af Amer: >60 mL/min (ref >60)
GFR calc non Af Amer: >60 mL/min (ref >60)
Glucose, Bld: 83 mg/dL (ref 70–99)
Potassium: 3.4 mmol/L — ABNORMAL LOW (ref 3.5–5.1)
Sodium: 137 mmol/L (ref 135–145)

## 2018-08-18 LAB — URINALYSIS, ROUTINE W REFLEX MICROSCOPIC
Bilirubin Urine: NEGATIVE
Glucose, UA: NEGATIVE mg/dL
Ketones, ur: NEGATIVE mg/dL
Leukocytes,Ua: NEGATIVE
Nitrite: NEGATIVE
Protein, ur: NEGATIVE mg/dL
Specific Gravity, Urine: 1.004 — ABNORMAL LOW (ref 1.005–1.030)
pH: 5 (ref 5.0–8.0)

## 2018-08-18 LAB — PREGNANCY, URINE: Preg Test, Ur: NEGATIVE

## 2018-08-18 MED ORDER — FLUTICASONE PROPIONATE 50 MCG/ACT NA SUSP: 1.0000 | Freq: Every day | NASAL | 0 refills | Status: AC

## 2018-08-18 MED ORDER — AMOXICILLIN-POT CLAVULANATE 875-125 MG PO TABS: 1.0000 | ORAL_TABLET | Freq: Two times a day (BID) | ORAL | 0 refills | Status: DC

## 2018-08-18 MED ORDER — SODIUM CHLORIDE 0.9 % IV BOLUS
1000.0000 mL | Freq: Once | INTRAVENOUS | Status: AC
  Administered 2018-08-18: 1000 mL via INTRAVENOUS

## 2018-08-18 MED ORDER — PREDNISONE 20 MG PO TABS: 60.0000 mg | ORAL_TABLET | Freq: Every day | ORAL | 0 refills | Status: AC

## 2018-08-18 MED ORDER — IPRATROPIUM-ALBUTEROL 0.5-2.5 (3) MG/3ML IN SOLN
3.0000 mL | Freq: Once | RESPIRATORY_TRACT | Status: AC
  Administered 2018-08-18: 3 mL via RESPIRATORY_TRACT
  Filled 2018-08-18: qty 3

## 2018-08-18 NOTE — Discharge Instructions (Addendum)
Take Augmentin until completed for your ear infection and probable bacterial sinus infection.  Take Flonase 1-2 times daily to help with nasal congestion and to drain your sinuses.  Take prednisone until completed for your asthma.  Use albuterol inhaler or nebulizer as needed.  Please return the emergency department you develop any new or worsening symptoms.  Please follow-up with your doctor if your symptoms are not improving over the next 3 to 4 days.

## 2018-08-18 NOTE — ED Notes (Signed)
Pt ambulated throughout hallway with assistance from her husband. O2 sats remained at 100% throughout, complained of dizziness and headache.

## 2018-08-18 NOTE — ED Provider Notes (Signed)
[facility] EMERGENCY DEPARTMENT Provider Note   CSN: [id number] Arrival date & time: 08/18/18  6010    History   Chief Complaint Chief Complaint  Patient presents with  . Influenza  . URI    HPI Barbara Richardson is a 48 y.o. female with history of asthma, obesity, hypertension who presents with a one-week history of influenza-like symptoms.  Patient has had cough and fever.  Her fever resolved 2 days ago.  She has been taking Tessalon and antihistamines.  She continues to have productive cough however she presents with significant facial pressure and right ear pain that has been worsening over the past few days.  She denies any chest pain, except with coughing.  She has had some shortness of breath, mostly secondary to nasal congestion.  She denies any abdominal pain, nausea, vomiting, diarrhea, urinary symptoms.     HPI  Past Medical History:  Diagnosis Date  . Abnormal Pap smear   . Anxiety   . Asthma   . Atrial fibrillation ([employer])   . Bipolar disorder ([place])   . BV (bacterial vaginosis) 08/24/2010  . Cervical radiculopathy 01/23/2018  . Cough variant asthma 10/18/2011   05/29/2017  After extensive coaching HFA effectiveness =    90% > continue symb 160 2bid - Allergy profile 05/29/17  >  Eos 0.6 /  IgE  172  RAST pos mild mold only  - FENO 05/29/2017  =   36 -   08/11/2017  After extensive coaching inhaler device  effectiveness =    75% > continue symicort 160  2bid/ singulair  - PFT's  08/11/2017  FEV1 2.04 (77 % ) ratio 73  p 7 % improvement from saba p symb/saba  . DDD (degenerative disc disease), cervical   . DJD (degenerative joint disease)   . DUB (dysfunctional uterine bleeding) 02/08/2010  . Dyspnea   . Dyspnea on exertion 05/06/2014   Followed in Pulmonary clinic/ [facility]/ [place] - trial off all acei 05/06/2014  > improved 07/20/14 with nl pfts x low erv > try off symbicort  - 05/29/2017  Walked RA x 3 laps @ 185 ft each stopped due to  End of  study, fast pace, leg pain and sob and end with sats down to 90%     . Endometrial polyp 03/2010  . GERD (gastroesophageal reflux disease)   . Hx of chlamydia infection 1995  . Hx of constipation 10/17/2010  . Hx of menorrhagia 03/2010  . Hypertension   . Inadequate sleep hygiene 08/31/2014  . Migraines   . Morbid obesity due to excess calories ([place]) 05/31/2017  . Obesity   . Obesity, morbid (more than 100 lbs over ideal weight or BMI > 40) ([place]) 2011  . OSA (obstructive sleep apnea) 08/31/2014  . Uterine fibroid 03/2010  . Yeast infection     Patient Active Problem List   Diagnosis Date Noted  . Cervical radiculopathy 01/23/2018  . Morbid obesity due to excess calories ([place]) 05/31/2017  . OSA (obstructive sleep apnea) 08/31/2014  . Inadequate sleep hygiene 08/31/2014  . Excessive daytime sleepiness 05/23/2014  . Dyspnea on exertion 05/06/2014  . Essential hypertension 10/18/2011  . Cough variant asthma 10/18/2011  . GERD (gastroesophageal reflux disease) 10/18/2011    Past Surgical History:  Procedure Laterality Date  . ANTERIOR CERVICAL DECOMP/DISCECTOMY FUSION N/A 01/23/2018   Procedure: ANTERIOR CERVICAL DECOMPRESSION/DISCECTOMY FUSION CERVICAL THREE- CERVICAL FOUR, CERVICAL FOUR- CERVICAL FIVE;  Surgeon: Consuella Lose, MD;  Location: [facility];  Service: Neurosurgery;  Laterality: N/A;  . BREAST REDUCTION SURGERY  1999  . CARPAL TUNNEL RELEASE    . CESAREAN SECTION    . HYSTEROSCOPY W/ ENDOMETRIAL ABLATION  2011  . KNEE SURGERY Left    fracture  . TONSILLECTOMY  2010  . TUBAL LIGATION  1999     OB History    Gravida  2   Para  2   Term      Preterm      AB      Living  2     SAB      TAB      Ectopic      Multiple      Live Births               Home Medications    Prior to Admission medications   Medication Sig Start Date End Date Taking? Authorizing Provider  albuterol (PROVENTIL HFA;VENTOLIN HFA) 108 (90 BASE) MCG/ACT inhaler Inhale 2 puffs  into the lungs every 6 (six) hours as needed for wheezing. 04/05/13   Gregor Hams, MD  albuterol (PROVENTIL) (2.5 MG/3ML) 0.083% nebulizer solution Take 2.5 mg by nebulization every 6 (six) hours as needed for wheezing or shortness of breath.    [provider]  amLODipine-benazepril (LOTREL) 10-40 MG capsule Take 1 capsule by mouth daily.    [provider]  amoxicillin-clavulanate (AUGMENTIN) 875-125 MG tablet Take 1 tablet by mouth every 12 (twelve) hours. 08/18/18   Merin Borjon, Bea Graff, PA-C  aspirin EC 81 MG tablet Take 81 mg by mouth daily.    [provider]  budesonide-formoterol (SYMBICORT) 160-4.5 MCG/ACT inhaler Inhale 2 puffs into the lungs 2 (two) times daily.    [provider]  budesonide-formoterol (SYMBICORT) 80-4.5 MCG/ACT inhaler Inhale 2 puffs into the lungs 2 (two) times daily. 09/25/17   Tanda Rockers, MD  buPROPion (WELLBUTRIN XL) 300 MG 24 hr tablet Take 300 mg by mouth daily.  05/10/14   [provider]  docusate sodium (COLACE) 100 MG capsule Take 100 mg by mouth daily.    [provider]  FLUoxetine (PROZAC) 20 MG tablet Take 20 mg by mouth daily.    [provider]  fluticasone (FLONASE) 50 MCG/ACT nasal spray Place 1 spray into both nostrils daily. 08/18/18   Xavior Niazi, Bea Graff, PA-C  furosemide (LASIX) 20 MG tablet Take 20 mg by mouth 2 (two) times daily.    [provider]  gabapentin (NEURONTIN) 300 MG capsule Take 300 mg by mouth 2 (two) times daily.     [provider]  HYDROcodone-acetaminophen (NORCO/VICODIN) 5-325 MG tablet Take 1 tablet by mouth every 4 (four) hours as needed for moderate pain ((score 4 to 6)). 01/24/18   Meyran, Ocie Cornfield, NP  levonorgestrel (MIRENA, 52 MG,) 20 MCG/24HR IUD Place 1 Intra Uterine Device vaginally. 12/20/13 12/21/18  [provider]  losartan (COZAAR) 100 MG tablet Take 100 mg by mouth daily.  03/16/14   [provider]  methocarbamol  (ROBAXIN) 500 MG tablet Take 1 tablet (500 mg total) by mouth every 6 (six) hours as needed for muscle spasms. 01/24/18   Meyran, Ocie Cornfield, NP  metoprolol succinate (TOPROL-XL) 25 MG 24 hr tablet Take 25 mg by mouth daily.     [provider]  montelukast (SINGULAIR) 10 MG tablet Take 10 mg by mouth at bedtime.    [provider]  naproxen (NAPROSYN) 500 MG tablet Take 500 mg by mouth 2 (  two) times daily with a meal.    [provider]  omeprazole (PRILOSEC) 40 MG capsule Take 40 mg by mouth daily.    [provider]  predniSONE (DELTASONE) 20 MG tablet Take 3 tablets (60 mg total) by mouth daily. 08/18/18   Raisa Ditto, Bea Graff, PA-C  SUMAtriptan (IMITREX) 100 MG tablet Take 100 mg by mouth daily as needed (migraine).     [provider]  tiZANidine (ZANAFLEX) 4 MG capsule Take 4 mg by mouth 3 (three) times daily.    [provider]  topiramate (TOPAMAX) 100 MG tablet Take 100 mg by mouth 2 (two) times daily.    [provider]  traMADol (ULTRAM) 50 MG tablet Take 50 mg by mouth 2 (two) times daily.    [provider]    Family History Family History  Problem Relation Age of Onset  . Diabetes Father   . Cancer Maternal Grandfather   . Heart disease Maternal Grandmother     Social History Social History   Tobacco Use  . Smoking status: Never Smoker  . Smokeless tobacco: Former Network engineer Use Topics  . Alcohol use: No    Alcohol/week: 0.0 standard drinks  . Drug use: No     Allergies   Other   Review of Systems Review of Systems  Constitutional: Positive for fever (resolved). Negative for chills.  HENT: Positive for sinus pressure and sinus pain. Negative for facial swelling and sore throat.   Respiratory: Positive for cough. Negative for shortness of breath.   Cardiovascular: Negative for chest pain.  Gastrointestinal: Negative for abdominal pain, nausea and vomiting.  Genitourinary: Negative for  dysuria.  Musculoskeletal: Positive for myalgias. Negative for back pain.  Skin: Negative for rash and wound.  Neurological: Negative for headaches.  Psychiatric/Behavioral: The patient is not nervous/anxious.      Physical Exam Updated Vital Signs BP (!) 142/76   Pulse 83   Temp 98.6 F (37 C) (Oral)   Resp 16   LMP  (LMP Unknown) Comment: Pt states she has had issues with consistent bleeding since Nov and is being seen for that issue.  SpO2 100%   Physical Exam Vitals signs and nursing note reviewed.  Constitutional:      General: She is not in acute distress.    Appearance: She is well-developed. She is not diaphoretic.  HENT:     Head: Normocephalic and atraumatic.      Right Ear: Tympanic membrane is erythematous and bulging.     Left Ear: Tympanic membrane is not erythematous.     Nose: Congestion present.     Mouth/Throat:     Pharynx: No oropharyngeal exudate.     Tonsils: No tonsillar exudate or tonsillar abscesses. Swelling: 0 on the right. 0 on the left.  Eyes:     General: No scleral icterus.       Right eye: No discharge.        Left eye: No discharge.     Conjunctiva/sclera: Conjunctivae normal.     Pupils: Pupils are equal, round, and reactive to light.  Neck:     Musculoskeletal: Normal range of motion and neck supple.     Thyroid: No thyromegaly.  Cardiovascular:     Rate and Rhythm: Normal rate and regular rhythm.     Heart sounds: Normal heart sounds. No murmur. No friction rub. No gallop.   Pulmonary:     Effort: Pulmonary effort is normal. No respiratory distress.  Breath sounds: No stridor. Wheezing (few, expiratory in the bases) present. No rales.  Abdominal:     General: Bowel sounds are normal. There is no distension.     Palpations: Abdomen is soft.     Tenderness: There is abdominal tenderness in the right lower quadrant. There is no guarding or rebound.     Comments: Patient reports some tenderness on right lower quadrant, however she  reports that this is the same spot that was tender 1 month ago with OB/GYN; she does not wish for [place] to work this up today  Lymphadenopathy:     Cervical: No cervical adenopathy.  Skin:    General: Skin is warm and dry.     Coloration: Skin is not pale.     Findings: No rash.  Neurological:     Mental Status: She is alert.     Coordination: Coordination normal.      ED Treatments / Results  Labs (all labs ordered are listed, but only abnormal results are displayed) Labs Reviewed  URINALYSIS, ROUTINE W REFLEX MICROSCOPIC - Abnormal; Notable for the following components:      Result Value   Color, Urine COLORLESS (*)    Specific Gravity, Urine 1.004 (*)    Hgb urine dipstick SMALL (*)    Bacteria, UA RARE (*)    All other components within normal limits  BASIC METABOLIC PANEL - Abnormal; Notable for the following components:   Potassium 3.4 (*)    CO2 21 (*)    All other components within normal limits  PREGNANCY, URINE  CBC WITH DIFFERENTIAL/PLATELET    EKG None  Radiology Dg Chest 2 View  Result Date: 08/18/2018 CLINICAL DATA:  Fever with sinus pain and pressure. Earache with nausea and dizziness for 1 week. EXAM: CHEST - 2 VIEW COMPARISON:  07/25/2017 FINDINGS: Normal heart, mediastinum and hila. Clear lungs.  No pleural effusion or pneumothorax. Skeletal structures are unremarkable. IMPRESSION: No active cardiopulmonary disease. Electronically Signed   By: Lajean Manes M.D.   On: 08/18/2018 10:30    Procedures Procedures (including critical care time)  Medications Ordered in ED Medications  ipratropium-albuterol (DUONEB) 0.5-2.5 (3) MG/3ML nebulizer solution 3 mL (3 mLs Nebulization Given 08/18/18 1052)  sodium chloride 0.9 % bolus 1,000 mL (0 mLs Intravenous Stopped 08/18/18 1442)     Initial Impression / Assessment and Plan / ED Course  I have reviewed the triage vital signs and the nursing notes.  Pertinent labs & imaging results that were available during  my care of the patient were reviewed by me and considered in my medical decision making (see chart for details).  Clinical Course as of Aug 19 1527  Tue Aug 18, 2018  1120 Wheezing resolved after DuoNeb.  Will check pulse ox while ambulating.  I was told by nurse that patient is urinating very frequently, however patient states this is normal for her.  We will add UA.   [AL]    Clinical Course User Index [AL] Frederica Kuster, PA-C       Patient presenting with right ear pain and congestion after influenza last week.  Labs are unremarkable.  Chest x-ray is clear.  Will treat with Augmentin to cover for bacterial sinusitis.  We will also provide Flonase.  We will also add a 5-day burst of prednisone to cover for asthma exacerbation as patient has had some wheezing and mild shortness of breath.  She ambulated with pulse ox at 100%.  She reported dizziness with walking,  so IV fluids were given.  Patient is feeling improved after this.  Patient has albuterol nebulizer solution and albuterol inhaler at home.  Advised to continue using.  Return precautions discussed.  Patient understands and agrees with plan.  Patient vital stable throughout ED course and discharged in satisfactory condition.  Final Clinical Impressions(s) / ED Diagnoses   Final diagnoses:  Acute suppurative otitis media of right ear without spontaneous rupture of tympanic membrane, recurrence not specified  Acute frontal sinusitis, recurrence not specified    ED Discharge Orders         Ordered    predniSONE (DELTASONE) 20 MG tablet  Daily     08/18/18 1406    fluticasone (FLONASE) 50 MCG/ACT nasal spray  Daily     08/18/18 1406    amoxicillin-clavulanate (AUGMENTIN) 875-125 MG tablet  Every 12 hours     08/18/18 [address] PA-C 08/18/18 1529    Fredia Sorrow, MD 08/19/18 1302

## 2018-08-18 NOTE — ED Notes (Signed)
Pt on bedside toilet.

## 2018-08-18 NOTE — ED Triage Notes (Signed)
Pt reports being diagnosed with the flu last week. Pt states this week she feels this has turned into a sinus infection. Pt reports for the last 1 week she has had pressure and pain in her sinus with cough and nasal congestion. Pt is currently out of tessalon given to her last week and still taking a OTC antihistamine and cough suppressant-while no relief of URI sxs.

## 2018-10-19 DIAGNOSIS — J45901 Unspecified asthma with (acute) exacerbation: Secondary | ICD-10-CM | POA: Diagnosis not present

## 2018-10-19 DIAGNOSIS — J988 Other specified respiratory disorders: Secondary | ICD-10-CM | POA: Diagnosis not present

## 2018-11-06 DIAGNOSIS — E7800 Pure hypercholesterolemia, unspecified: Secondary | ICD-10-CM | POA: Diagnosis not present

## 2018-11-06 DIAGNOSIS — F418 Other specified anxiety disorders: Secondary | ICD-10-CM | POA: Diagnosis not present

## 2018-11-06 DIAGNOSIS — K219 Gastro-esophageal reflux disease without esophagitis: Secondary | ICD-10-CM | POA: Diagnosis not present

## 2018-11-06 DIAGNOSIS — I10 Essential (primary) hypertension: Secondary | ICD-10-CM | POA: Diagnosis not present

## 2018-11-06 DIAGNOSIS — G43009 Migraine without aura, not intractable, without status migrainosus: Secondary | ICD-10-CM | POA: Diagnosis not present

## 2018-11-06 DIAGNOSIS — Z79899 Other long term (current) drug therapy: Secondary | ICD-10-CM | POA: Diagnosis not present

## 2018-11-06 DIAGNOSIS — J4540 Moderate persistent asthma, uncomplicated: Secondary | ICD-10-CM | POA: Diagnosis not present

## 2018-11-12 DIAGNOSIS — E7800 Pure hypercholesterolemia, unspecified: Secondary | ICD-10-CM | POA: Diagnosis not present

## 2018-11-12 DIAGNOSIS — Z79899 Other long term (current) drug therapy: Secondary | ICD-10-CM | POA: Diagnosis not present

## 2018-12-18 DIAGNOSIS — M79601 Pain in right arm: Secondary | ICD-10-CM | POA: Diagnosis not present

## 2018-12-18 DIAGNOSIS — M79602 Pain in left arm: Secondary | ICD-10-CM | POA: Diagnosis not present

## 2018-12-18 DIAGNOSIS — M542 Cervicalgia: Secondary | ICD-10-CM | POA: Diagnosis not present

## 2018-12-28 DIAGNOSIS — I10 Essential (primary) hypertension: Secondary | ICD-10-CM | POA: Diagnosis not present

## 2018-12-28 DIAGNOSIS — Z6841 Body Mass Index (BMI) 40.0 and over, adult: Secondary | ICD-10-CM | POA: Diagnosis not present

## 2018-12-28 DIAGNOSIS — M542 Cervicalgia: Secondary | ICD-10-CM | POA: Diagnosis not present

## 2019-01-06 ENCOUNTER — Other Ambulatory Visit: Payer: Self-pay | Admitting: Physician Assistant

## 2019-01-06 DIAGNOSIS — M542 Cervicalgia: Secondary | ICD-10-CM

## 2019-01-12 ENCOUNTER — Other Ambulatory Visit: Payer: Medicare HMO

## 2019-01-19 DIAGNOSIS — I10 Essential (primary) hypertension: Secondary | ICD-10-CM | POA: Diagnosis not present

## 2019-01-19 DIAGNOSIS — H5210 Myopia, unspecified eye: Secondary | ICD-10-CM | POA: Diagnosis not present

## 2019-02-06 ENCOUNTER — Other Ambulatory Visit: Payer: Self-pay

## 2019-02-06 ENCOUNTER — Other Ambulatory Visit: Payer: Self-pay | Admitting: Physician Assistant

## 2019-02-06 ENCOUNTER — Ambulatory Visit
Admission: RE | Admit: 2019-02-06 | Discharge: 2019-02-06 | Disposition: A | Discharge status: Home / Self Care | Payer: Medicare HMO | Source: Ambulatory Visit | Attending: Physician Assistant | Admitting: Physician Assistant

## 2019-02-06 DIAGNOSIS — M542 Cervicalgia: Secondary | ICD-10-CM

## 2019-02-06 DIAGNOSIS — R6889 Other general symptoms and signs: Secondary | ICD-10-CM | POA: Diagnosis not present

## 2019-02-08 DIAGNOSIS — N898 Other specified noninflammatory disorders of vagina: Secondary | ICD-10-CM | POA: Diagnosis not present

## 2019-02-08 DIAGNOSIS — R6889 Other general symptoms and signs: Secondary | ICD-10-CM | POA: Diagnosis not present

## 2019-02-08 DIAGNOSIS — N939 Abnormal uterine and vaginal bleeding, unspecified: Secondary | ICD-10-CM | POA: Diagnosis not present

## 2019-02-18 DIAGNOSIS — R6889 Other general symptoms and signs: Secondary | ICD-10-CM | POA: Diagnosis not present

## 2019-02-24 DIAGNOSIS — M542 Cervicalgia: Secondary | ICD-10-CM | POA: Diagnosis not present

## 2019-02-24 DIAGNOSIS — M25511 Pain in right shoulder: Secondary | ICD-10-CM | POA: Diagnosis not present

## 2019-02-24 DIAGNOSIS — I10 Essential (primary) hypertension: Secondary | ICD-10-CM | POA: Diagnosis not present

## 2019-02-24 DIAGNOSIS — G8929 Other chronic pain: Secondary | ICD-10-CM | POA: Diagnosis not present

## 2019-03-18 DIAGNOSIS — H524 Presbyopia: Secondary | ICD-10-CM | POA: Diagnosis not present

## 2019-03-18 DIAGNOSIS — H52221 Regular astigmatism, right eye: Secondary | ICD-10-CM | POA: Diagnosis not present

## 2019-04-02 DIAGNOSIS — Z20828 Contact with and (suspected) exposure to other viral communicable diseases: Secondary | ICD-10-CM | POA: Diagnosis not present

## 2019-04-02 DIAGNOSIS — R0981 Nasal congestion: Secondary | ICD-10-CM | POA: Diagnosis not present

## 2019-04-03 DIAGNOSIS — Z20828 Contact with and (suspected) exposure to other viral communicable diseases: Secondary | ICD-10-CM | POA: Diagnosis not present

## 2019-05-18 DIAGNOSIS — K219 Gastro-esophageal reflux disease without esophagitis: Secondary | ICD-10-CM | POA: Diagnosis not present

## 2019-05-18 DIAGNOSIS — Z0000 Encounter for general adult medical examination without abnormal findings: Secondary | ICD-10-CM | POA: Diagnosis not present

## 2019-05-18 DIAGNOSIS — G43009 Migraine without aura, not intractable, without status migrainosus: Secondary | ICD-10-CM | POA: Diagnosis not present

## 2019-05-18 DIAGNOSIS — I10 Essential (primary) hypertension: Secondary | ICD-10-CM | POA: Diagnosis not present

## 2019-05-18 DIAGNOSIS — J4540 Moderate persistent asthma, uncomplicated: Secondary | ICD-10-CM | POA: Diagnosis not present

## 2019-05-18 DIAGNOSIS — G4733 Obstructive sleep apnea (adult) (pediatric): Secondary | ICD-10-CM | POA: Diagnosis not present

## 2019-05-18 DIAGNOSIS — F418 Other specified anxiety disorders: Secondary | ICD-10-CM | POA: Diagnosis not present

## 2019-05-18 DIAGNOSIS — J309 Allergic rhinitis, unspecified: Secondary | ICD-10-CM | POA: Diagnosis not present

## 2019-06-02 DIAGNOSIS — J988 Other specified respiratory disorders: Secondary | ICD-10-CM | POA: Diagnosis not present

## 2019-06-02 DIAGNOSIS — Z8709 Personal history of other diseases of the respiratory system: Secondary | ICD-10-CM | POA: Diagnosis not present

## 2019-06-02 DIAGNOSIS — R0981 Nasal congestion: Secondary | ICD-10-CM | POA: Diagnosis not present

## 2019-06-02 DIAGNOSIS — R062 Wheezing: Secondary | ICD-10-CM | POA: Diagnosis not present

## 2019-06-02 DIAGNOSIS — R05 Cough: Secondary | ICD-10-CM | POA: Diagnosis not present

## 2019-06-05 DIAGNOSIS — J988 Other specified respiratory disorders: Secondary | ICD-10-CM | POA: Diagnosis not present

## 2019-06-17 DIAGNOSIS — Z13220 Encounter for screening for lipoid disorders: Secondary | ICD-10-CM | POA: Diagnosis not present

## 2019-06-17 DIAGNOSIS — E7800 Pure hypercholesterolemia, unspecified: Secondary | ICD-10-CM | POA: Diagnosis not present

## 2019-06-17 DIAGNOSIS — Z1329 Encounter for screening for other suspected endocrine disorder: Secondary | ICD-10-CM | POA: Diagnosis not present

## 2019-06-17 DIAGNOSIS — Z0000 Encounter for general adult medical examination without abnormal findings: Secondary | ICD-10-CM | POA: Diagnosis not present

## 2019-06-21 DIAGNOSIS — Z0000 Encounter for general adult medical examination without abnormal findings: Secondary | ICD-10-CM | POA: Diagnosis not present

## 2019-08-01 IMAGING — RF DG CERVICAL SPINE 1V
1 series · 1 of 1 positions shown · non-contrast
Comparison: Cervical MRI 09/27/2017

CLINICAL DATA: ACDF C3 through C5

EXAM:
DG C-ARM 61-120 MIN; DG CERVICAL SPINE - 1 VIEW

[Series 1: run · 1 of 1 slices shown]
[im 1/1]
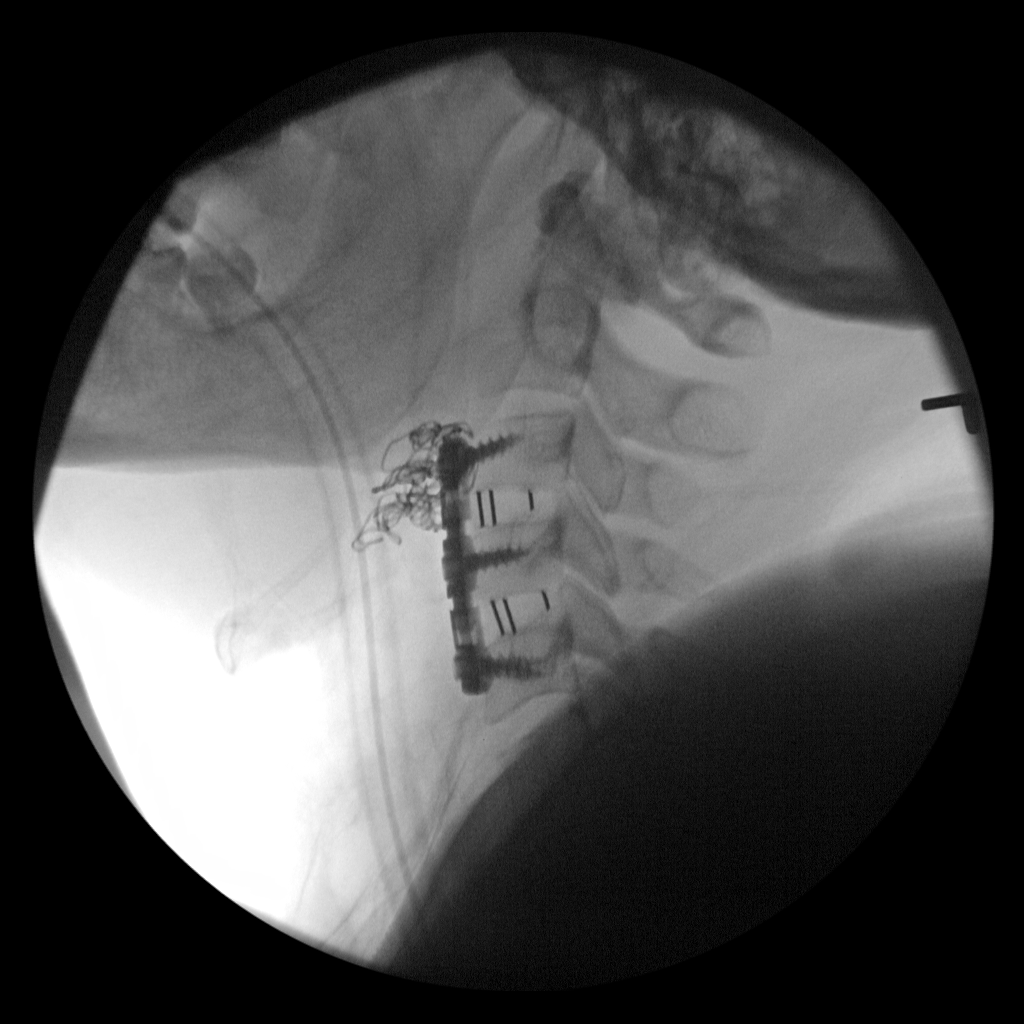

[1 of 1 positions shown; findings below may reference images not displayed]

FINDINGS: Single lateral image of the cervical spine was obtained in the
operating room.

ACDF with plate and interbody spacers at C3-4 and C4-5 in good
position. Sponge in the anterior soft tissues at the C3 level.
IMPRESSION: Satisfactory ACDF C3 through C5. Surgical sponge in the soft
tissues.

## 2019-08-09 DIAGNOSIS — G629 Polyneuropathy, unspecified: Secondary | ICD-10-CM | POA: Diagnosis not present

## 2019-08-09 DIAGNOSIS — G43909 Migraine, unspecified, not intractable, without status migrainosus: Secondary | ICD-10-CM | POA: Diagnosis not present

## 2019-08-09 DIAGNOSIS — F419 Anxiety disorder, unspecified: Secondary | ICD-10-CM | POA: Diagnosis not present

## 2019-08-09 DIAGNOSIS — Z6841 Body Mass Index (BMI) 40.0 and over, adult: Secondary | ICD-10-CM | POA: Diagnosis not present

## 2019-08-09 DIAGNOSIS — R69 Illness, unspecified: Secondary | ICD-10-CM | POA: Diagnosis not present

## 2019-08-09 DIAGNOSIS — J45909 Unspecified asthma, uncomplicated: Secondary | ICD-10-CM | POA: Diagnosis not present

## 2019-08-09 DIAGNOSIS — K219 Gastro-esophageal reflux disease without esophagitis: Secondary | ICD-10-CM | POA: Diagnosis not present

## 2019-08-09 DIAGNOSIS — G8929 Other chronic pain: Secondary | ICD-10-CM | POA: Diagnosis not present

## 2019-08-09 DIAGNOSIS — I10 Essential (primary) hypertension: Secondary | ICD-10-CM | POA: Diagnosis not present

## 2019-10-12 DIAGNOSIS — J4541 Moderate persistent asthma with (acute) exacerbation: Secondary | ICD-10-CM | POA: Diagnosis not present

## 2019-11-10 DIAGNOSIS — R202 Paresthesia of skin: Secondary | ICD-10-CM | POA: Diagnosis not present

## 2019-11-10 DIAGNOSIS — R42 Dizziness and giddiness: Secondary | ICD-10-CM | POA: Diagnosis not present

## 2020-01-04 DIAGNOSIS — R0600 Dyspnea, unspecified: Secondary | ICD-10-CM | POA: Diagnosis not present

## 2020-01-04 DIAGNOSIS — J309 Allergic rhinitis, unspecified: Secondary | ICD-10-CM | POA: Diagnosis not present

## 2020-01-04 DIAGNOSIS — J45901 Unspecified asthma with (acute) exacerbation: Secondary | ICD-10-CM | POA: Diagnosis not present

## 2020-01-05 DIAGNOSIS — J209 Acute bronchitis, unspecified: Secondary | ICD-10-CM | POA: Diagnosis not present

## 2020-01-05 DIAGNOSIS — J45901 Unspecified asthma with (acute) exacerbation: Secondary | ICD-10-CM | POA: Diagnosis not present

## 2020-01-05 DIAGNOSIS — I10 Essential (primary) hypertension: Secondary | ICD-10-CM | POA: Diagnosis not present

## 2020-01-05 DIAGNOSIS — R0689 Other abnormalities of breathing: Secondary | ICD-10-CM | POA: Diagnosis not present

## 2020-01-05 DIAGNOSIS — R52 Pain, unspecified: Secondary | ICD-10-CM | POA: Diagnosis not present

## 2020-01-05 DIAGNOSIS — R0602 Shortness of breath: Secondary | ICD-10-CM | POA: Diagnosis not present

## 2020-01-05 DIAGNOSIS — R05 Cough: Secondary | ICD-10-CM | POA: Diagnosis not present

## 2020-01-05 DIAGNOSIS — R062 Wheezing: Secondary | ICD-10-CM | POA: Diagnosis not present

## 2020-01-05 DIAGNOSIS — J4541 Moderate persistent asthma with (acute) exacerbation: Secondary | ICD-10-CM | POA: Diagnosis not present

## 2020-01-17 DIAGNOSIS — J45901 Unspecified asthma with (acute) exacerbation: Secondary | ICD-10-CM | POA: Diagnosis not present

## 2020-01-19 DIAGNOSIS — J45901 Unspecified asthma with (acute) exacerbation: Secondary | ICD-10-CM | POA: Diagnosis not present

## 2020-01-19 DIAGNOSIS — H5213 Myopia, bilateral: Secondary | ICD-10-CM | POA: Diagnosis not present

## 2020-01-21 DIAGNOSIS — E662 Morbid (severe) obesity with alveolar hypoventilation: Secondary | ICD-10-CM | POA: Diagnosis not present

## 2020-01-21 DIAGNOSIS — R0603 Acute respiratory distress: Secondary | ICD-10-CM | POA: Diagnosis not present

## 2020-01-21 DIAGNOSIS — R5383 Other fatigue: Secondary | ICD-10-CM | POA: Diagnosis not present

## 2020-01-21 DIAGNOSIS — J4551 Severe persistent asthma with (acute) exacerbation: Secondary | ICD-10-CM | POA: Diagnosis not present

## 2020-01-21 DIAGNOSIS — J80 Acute respiratory distress syndrome: Secondary | ICD-10-CM | POA: Diagnosis not present

## 2020-01-21 DIAGNOSIS — Z20822 Contact with and (suspected) exposure to covid-19: Secondary | ICD-10-CM | POA: Diagnosis not present

## 2020-01-21 DIAGNOSIS — J189 Pneumonia, unspecified organism: Secondary | ICD-10-CM | POA: Diagnosis not present

## 2020-01-21 DIAGNOSIS — Z6841 Body Mass Index (BMI) 40.0 and over, adult: Secondary | ICD-10-CM | POA: Diagnosis not present

## 2020-01-21 DIAGNOSIS — R0600 Dyspnea, unspecified: Secondary | ICD-10-CM | POA: Diagnosis not present

## 2020-01-21 DIAGNOSIS — R05 Cough: Secondary | ICD-10-CM | POA: Diagnosis not present

## 2020-01-21 DIAGNOSIS — R0602 Shortness of breath: Secondary | ICD-10-CM | POA: Diagnosis not present

## 2020-01-22 DIAGNOSIS — E662 Morbid (severe) obesity with alveolar hypoventilation: Secondary | ICD-10-CM | POA: Diagnosis not present

## 2020-01-22 DIAGNOSIS — R0603 Acute respiratory distress: Secondary | ICD-10-CM | POA: Diagnosis not present

## 2020-01-22 DIAGNOSIS — R05 Cough: Secondary | ICD-10-CM | POA: Diagnosis not present

## 2020-01-23 DIAGNOSIS — R0603 Acute respiratory distress: Secondary | ICD-10-CM | POA: Diagnosis not present

## 2020-01-23 DIAGNOSIS — R0602 Shortness of breath: Secondary | ICD-10-CM | POA: Diagnosis not present

## 2020-01-23 DIAGNOSIS — E662 Morbid (severe) obesity with alveolar hypoventilation: Secondary | ICD-10-CM | POA: Diagnosis not present

## 2020-01-23 DIAGNOSIS — R05 Cough: Secondary | ICD-10-CM | POA: Diagnosis not present

## 2020-01-23 DIAGNOSIS — J189 Pneumonia, unspecified organism: Secondary | ICD-10-CM | POA: Diagnosis not present

## 2020-01-24 DIAGNOSIS — J189 Pneumonia, unspecified organism: Secondary | ICD-10-CM | POA: Diagnosis not present

## 2020-01-24 DIAGNOSIS — J4551 Severe persistent asthma with (acute) exacerbation: Secondary | ICD-10-CM | POA: Diagnosis not present

## 2020-01-25 DIAGNOSIS — J4551 Severe persistent asthma with (acute) exacerbation: Secondary | ICD-10-CM | POA: Diagnosis not present

## 2020-01-25 DIAGNOSIS — J189 Pneumonia, unspecified organism: Secondary | ICD-10-CM | POA: Diagnosis not present

## 2020-01-28 DIAGNOSIS — J45901 Unspecified asthma with (acute) exacerbation: Secondary | ICD-10-CM | POA: Diagnosis not present

## 2020-01-28 DIAGNOSIS — J4540 Moderate persistent asthma, uncomplicated: Secondary | ICD-10-CM | POA: Diagnosis not present

## 2020-02-23 DIAGNOSIS — Z1231 Encounter for screening mammogram for malignant neoplasm of breast: Secondary | ICD-10-CM | POA: Diagnosis not present

## 2020-02-23 DIAGNOSIS — Z1211 Encounter for screening for malignant neoplasm of colon: Secondary | ICD-10-CM | POA: Diagnosis not present

## 2020-02-23 DIAGNOSIS — Z113 Encounter for screening for infections with a predominantly sexual mode of transmission: Secondary | ICD-10-CM | POA: Diagnosis not present

## 2020-02-23 DIAGNOSIS — N939 Abnormal uterine and vaginal bleeding, unspecified: Secondary | ICD-10-CM | POA: Diagnosis not present

## 2020-02-23 DIAGNOSIS — N83202 Unspecified ovarian cyst, left side: Secondary | ICD-10-CM | POA: Diagnosis not present

## 2020-02-23 DIAGNOSIS — Z3040 Encounter for surveillance of contraceptives, unspecified: Secondary | ICD-10-CM | POA: Diagnosis not present

## 2020-02-23 DIAGNOSIS — Z6841 Body Mass Index (BMI) 40.0 and over, adult: Secondary | ICD-10-CM | POA: Diagnosis not present

## 2020-02-23 DIAGNOSIS — Z01419 Encounter for gynecological examination (general) (routine) without abnormal findings: Secondary | ICD-10-CM | POA: Diagnosis not present

## 2020-02-23 DIAGNOSIS — R69 Illness, unspecified: Secondary | ICD-10-CM | POA: Diagnosis not present

## 2020-02-24 IMAGING — CR DG CHEST 2V
2 series · 2 of 2 positions shown · non-contrast
Comparison: 07/25/2017

CLINICAL DATA: Fever with sinus pain and pressure. Earache with
nausea and dizziness for 1 week.

EXAM:
CHEST - 2 VIEW

[chest pa]
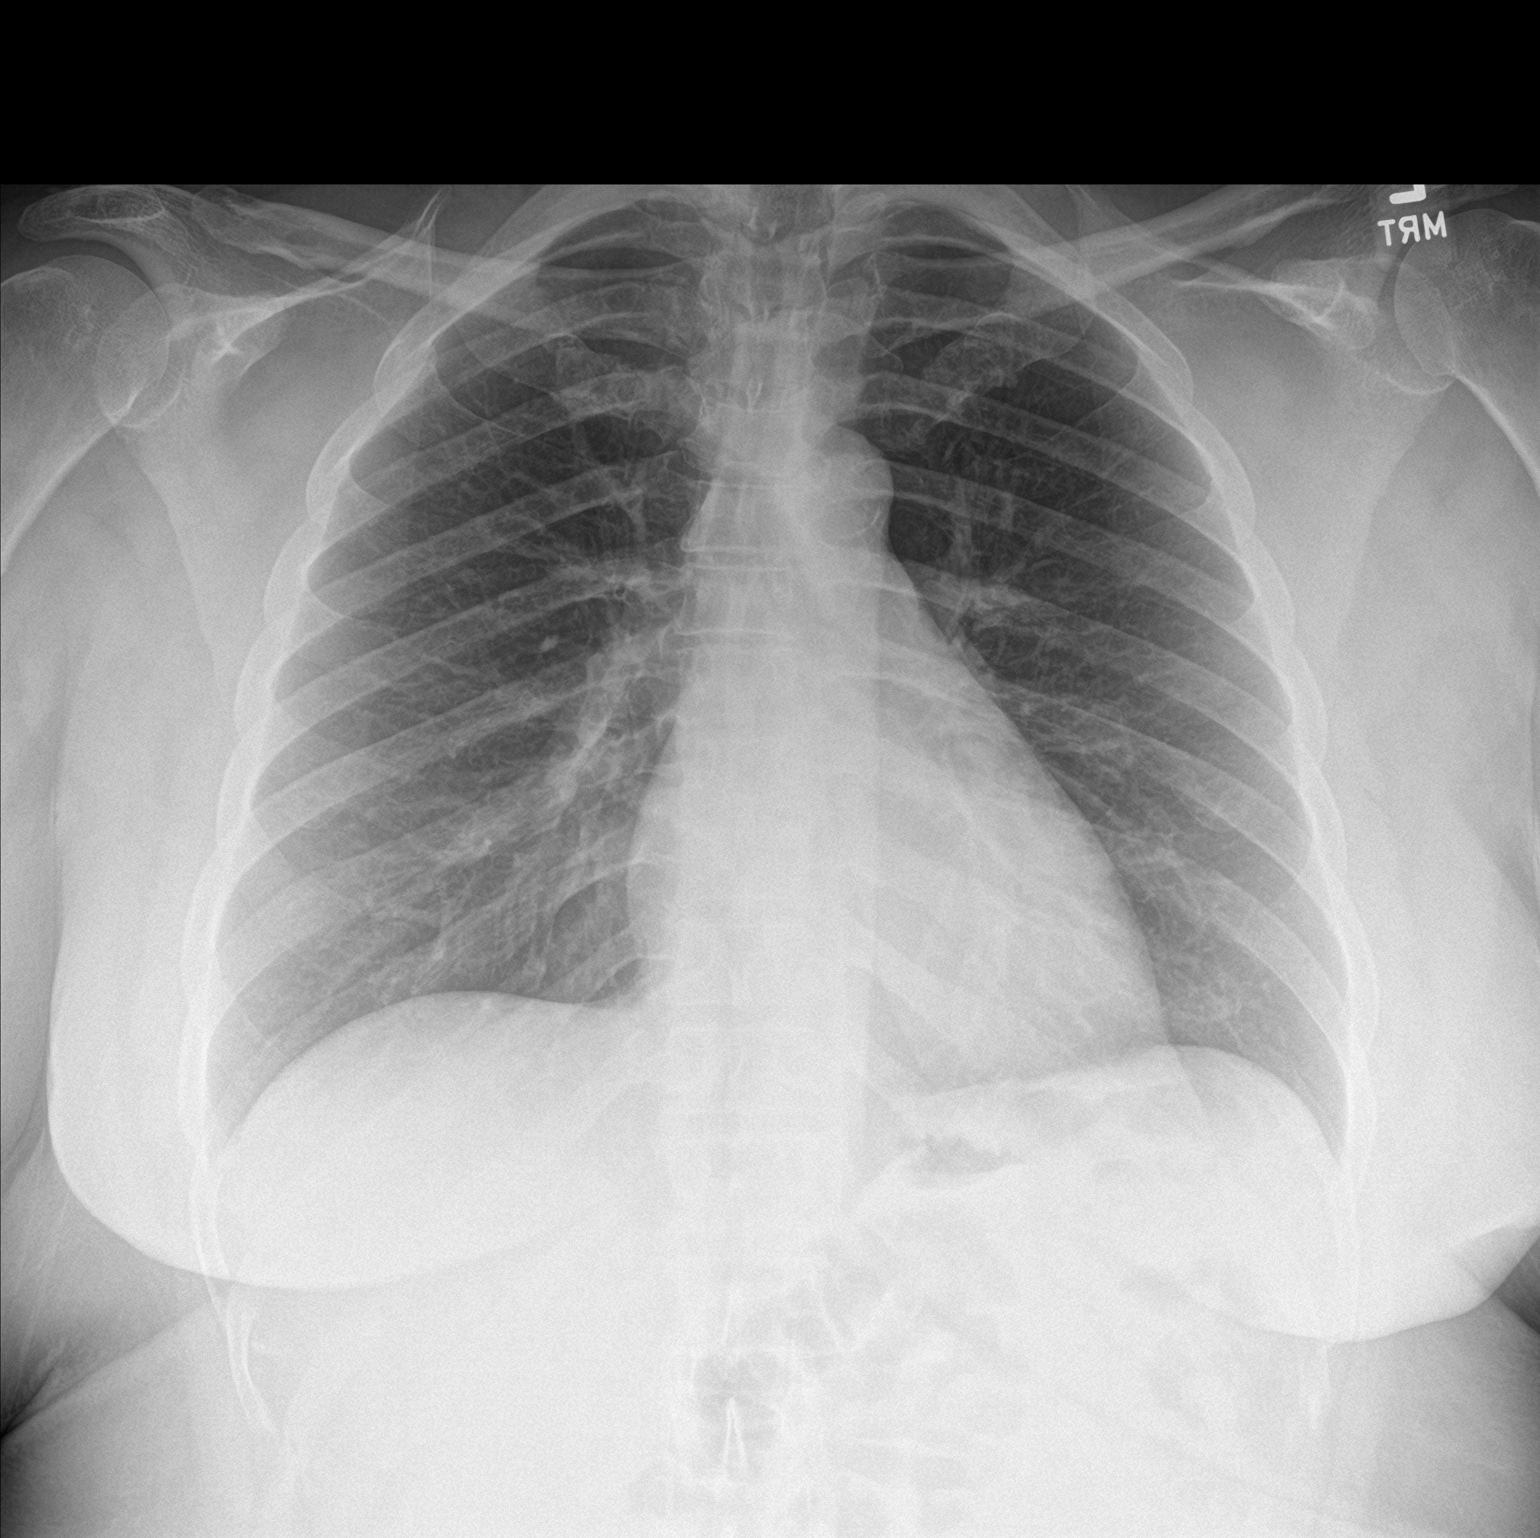

[chest lat]
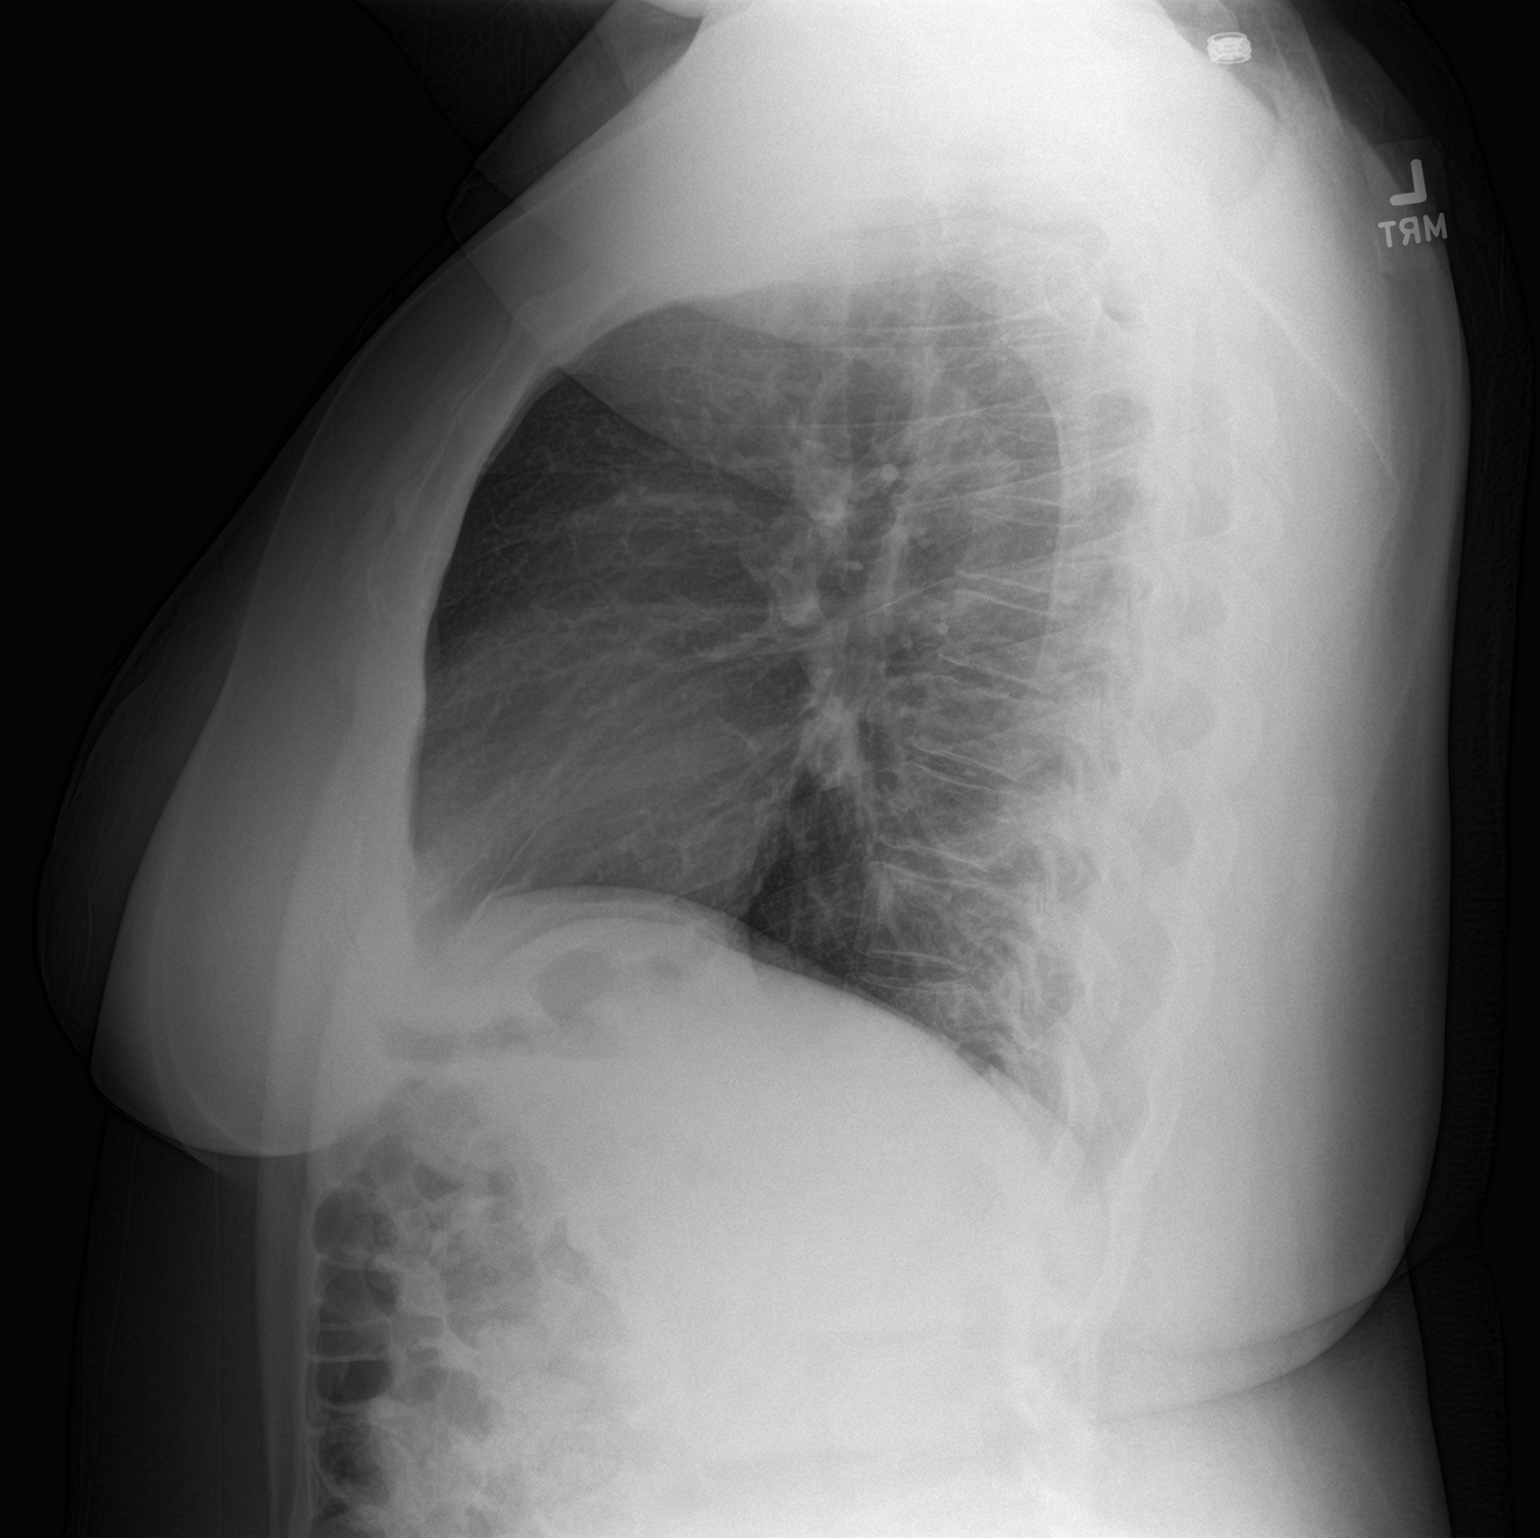

[2 of 2 positions shown; findings below may reference images not displayed]

FINDINGS: Normal heart, mediastinum and hila.

Clear lungs.  No pleural effusion or pneumothorax.

Skeletal structures are unremarkable.
IMPRESSION: No active cardiopulmonary disease.

## 2020-02-28 DIAGNOSIS — R69 Illness, unspecified: Secondary | ICD-10-CM | POA: Diagnosis not present

## 2020-09-28 DIAGNOSIS — R519 Headache, unspecified: Secondary | ICD-10-CM | POA: Diagnosis not present

## 2020-09-28 DIAGNOSIS — M7730 Calcaneal spur, unspecified foot: Secondary | ICD-10-CM | POA: Diagnosis not present

## 2020-09-28 DIAGNOSIS — M7731 Calcaneal spur, right foot: Secondary | ICD-10-CM | POA: Diagnosis not present

## 2020-09-28 DIAGNOSIS — M79671 Pain in right foot: Secondary | ICD-10-CM | POA: Diagnosis not present

## 2020-09-28 DIAGNOSIS — M19071 Primary osteoarthritis, right ankle and foot: Secondary | ICD-10-CM | POA: Diagnosis not present

## 2020-09-28 DIAGNOSIS — G8929 Other chronic pain: Secondary | ICD-10-CM | POA: Diagnosis not present

## 2020-09-28 DIAGNOSIS — M722 Plantar fascial fibromatosis: Secondary | ICD-10-CM | POA: Diagnosis not present

## 2020-09-28 DIAGNOSIS — I10 Essential (primary) hypertension: Secondary | ICD-10-CM | POA: Diagnosis not present

## 2020-11-03 DIAGNOSIS — I509 Heart failure, unspecified: Secondary | ICD-10-CM | POA: Diagnosis not present

## 2020-11-03 DIAGNOSIS — R079 Chest pain, unspecified: Secondary | ICD-10-CM | POA: Diagnosis not present

## 2020-11-03 DIAGNOSIS — M549 Dorsalgia, unspecified: Secondary | ICD-10-CM | POA: Diagnosis not present

## 2020-11-03 DIAGNOSIS — R0789 Other chest pain: Secondary | ICD-10-CM | POA: Diagnosis not present

## 2020-11-03 DIAGNOSIS — M25511 Pain in right shoulder: Secondary | ICD-10-CM | POA: Diagnosis not present

## 2020-11-03 DIAGNOSIS — I110 Hypertensive heart disease with heart failure: Secondary | ICD-10-CM | POA: Diagnosis not present

## 2020-11-08 DIAGNOSIS — R079 Chest pain, unspecified: Secondary | ICD-10-CM | POA: Diagnosis not present

## 2020-11-09 ENCOUNTER — Telehealth: Payer: Self-pay | Admitting: Cardiovascular Disease

## 2020-11-09 NOTE — Telephone Encounter (Signed)
Called patient and left message to schedule apt- not a new pt- received referral from [place] from [facility] at [facility] area - eval, chest pain, recent ER visit, needs assessment

## 2020-12-25 ENCOUNTER — Other Ambulatory Visit: Payer: Self-pay

## 2020-12-25 ENCOUNTER — Ambulatory Visit (INDEPENDENT_AMBULATORY_CARE_PROVIDER_SITE_OTHER): Payer: Medicare HMO | Admitting: Cardiology

## 2020-12-25 ENCOUNTER — Encounter: Payer: Self-pay | Admitting: Cardiology

## 2020-12-25 VITALS — BP 140/82 | HR 85 | Ht 69.0 in | Wt 372.0 lb

## 2020-12-25 DIAGNOSIS — R0789 Other chest pain: Secondary | ICD-10-CM

## 2020-12-25 DIAGNOSIS — R600 Localized edema: Secondary | ICD-10-CM

## 2020-12-25 NOTE — Patient Instructions (Signed)
Medication Instructions:  Continue all current medications.  Labwork: none  Testing/Procedures: none  Follow-Up: As needed.    Any Other Special Instructions Will Be Listed Below (If Applicable).  If you need a refill on your cardiac medications before your next appointment, please call your pharmacy.  

## 2020-12-25 NOTE — Progress Notes (Signed)
Clinical Summary Ms. Mostafa is a 50 y.o.female seen today as an add on for recent ER visit with chest pain. Previously seen by Dr Johnsie Cancel   Chest pain - ER visit to [facility] 11/03/2020 - from ER notes right shoulder and right upper back, worst with movement - trop 10, bnp mildly elevated 169. CXR no acute process. EKG per report no acute ischemic changes (tracing not available)  - symptoms started in April. - right sided sharp, would vary in severity. Worst with deep breathing, worst with position. Better with pressing area. Symptoms would last all day long several hours. Can be tender to palpations.  - multiple episodes, last episode about 1 week ago.    Past Medical History:  Diagnosis Date   Abnormal Pap smear    Anxiety    Asthma    Atrial fibrillation (HCC)    Bipolar disorder (HCC)    BV (bacterial vaginosis) 08/24/2010   Cervical radiculopathy 01/23/2018   Cough variant asthma 10/18/2011   05/29/2017  After extensive coaching HFA effectiveness =    90% > continue symb 160 2bid - Allergy profile 05/29/17  >  Eos 0.6 /  IgE  172  RAST pos mild mold only  - FENO 05/29/2017  =   36 -   08/11/2017  After extensive coaching inhaler device  effectiveness =    75% > continue symicort 160  2bid/ singulair  - PFT's  08/11/2017  FEV1 2.04 (77 % ) ratio 73  p 7 % improvement from saba p symb/saba   DDD (degenerative disc disease), cervical    DJD (degenerative joint disease)    DUB (dysfunctional uterine bleeding) 02/08/2010   Dyspnea    Dyspnea on exertion 05/06/2014   Followed in Pulmonary clinic/ [facility]/ [place] - trial off all acei 05/06/2014  > improved 07/20/14 with nl pfts x low erv > try off symbicort  - 05/29/2017  Walked RA x 3 laps @ 185 ft each stopped due to  End of study, fast pace, leg pain and sob and end with sats down to 90%      Endometrial polyp 03/2010   GERD (gastroesophageal reflux disease)    Hx of chlamydia infection 1995   Hx of constipation 10/17/2010   Hx  of menorrhagia 03/2010   Hypertension    Inadequate sleep hygiene 08/31/2014   Migraines    Morbid obesity due to excess calories ([place]) 05/31/2017   Obesity    Obesity, morbid (more than 100 lbs over ideal weight or BMI > 40) ([place]) 2011   OSA (obstructive sleep apnea) 08/31/2014   Uterine fibroid 03/2010   Yeast infection      Allergies  Allergen Reactions   Other Hives    Peaches     Current Outpatient Medications  Medication Sig Dispense Refill   albuterol (PROVENTIL HFA;VENTOLIN HFA) 108 (90 BASE) MCG/ACT inhaler Inhale 2 puffs into the lungs every 6 (six) hours as needed for wheezing. 1 Inhaler 2   albuterol (PROVENTIL) (2.5 MG/3ML) 0.083% nebulizer solution Take 2.5 mg by nebulization every 6 (six) hours as needed for wheezing or shortness of breath.     amLODipine-benazepril (LOTREL) 10-40 MG capsule Take 1 capsule by mouth daily.     amoxicillin-clavulanate (AUGMENTIN) 875-125 MG tablet Take 1 tablet by mouth every 12 (twelve) hours. 14 tablet 0   aspirin EC 81 MG tablet Take 81 mg by mouth daily.     budesonide-formoterol (SYMBICORT) 160-4.5 MCG/ACT inhaler Inhale  2 puffs into the lungs 2 (two) times daily.     budesonide-formoterol (SYMBICORT) 80-4.5 MCG/ACT inhaler Inhale 2 puffs into the lungs 2 (two) times daily. 1 Inhaler 11   buPROPion (WELLBUTRIN XL) 300 MG 24 hr tablet Take 300 mg by mouth daily.   3   docusate sodium (COLACE) 100 MG capsule Take 100 mg by mouth daily.     FLUoxetine (PROZAC) 20 MG tablet Take 20 mg by mouth daily.     fluticasone (FLONASE) 50 MCG/ACT nasal spray Place 1 spray into both nostrils daily. 5 g 0   furosemide (LASIX) 20 MG tablet Take 20 mg by mouth 2 (two) times daily.     gabapentin (NEURONTIN) 300 MG capsule Take 300 mg by mouth 2 (two) times daily.      HYDROcodone-acetaminophen (NORCO/VICODIN) 5-325 MG tablet Take 1 tablet by mouth every 4 (four) hours as needed for moderate pain ((score 4 to 6)). 30 tablet 0   levonorgestrel (MIRENA, 52  MG,) 20 MCG/24HR IUD Place 1 Intra Uterine Device vaginally.     losartan (COZAAR) 100 MG tablet Take 100 mg by mouth daily.   3   methocarbamol (ROBAXIN) 500 MG tablet Take 1 tablet (500 mg total) by mouth every 6 (six) hours as needed for muscle spasms. 30 tablet 0   metoprolol succinate (TOPROL-XL) 25 MG 24 hr tablet Take 25 mg by mouth daily.      montelukast (SINGULAIR) 10 MG tablet Take 10 mg by mouth at bedtime.     naproxen (NAPROSYN) 500 MG tablet Take 500 mg by mouth 2 (two) times daily with a meal.     omeprazole (PRILOSEC) 40 MG capsule Take 40 mg by mouth daily.     predniSONE (DELTASONE) 20 MG tablet Take 3 tablets (60 mg total) by mouth daily. 15 tablet 0   SUMAtriptan (IMITREX) 100 MG tablet Take 100 mg by mouth daily as needed (migraine).      tiZANidine (ZANAFLEX) 4 MG capsule Take 4 mg by mouth 3 (three) times daily.     topiramate (TOPAMAX) 100 MG tablet Take 100 mg by mouth 2 (two) times daily.     traMADol (ULTRAM) 50 MG tablet Take 50 mg by mouth 2 (two) times daily.     No current facility-administered medications for this visit.     Past Surgical History:  Procedure Laterality Date   ANTERIOR CERVICAL DECOMP/DISCECTOMY FUSION N/A 01/23/2018   Procedure: ANTERIOR CERVICAL DECOMPRESSION/DISCECTOMY FUSION CERVICAL THREE- CERVICAL FOUR, CERVICAL FOUR- CERVICAL FIVE;  Surgeon: Consuella Lose, MD;  Location: [facility];  Service: Neurosurgery;  Laterality: N/A;   BREAST REDUCTION SURGERY  1999   CARPAL TUNNEL RELEASE     CESAREAN SECTION     HYSTEROSCOPY W/ ENDOMETRIAL ABLATION  2011   KNEE SURGERY Left    fracture   TONSILLECTOMY  2010   TUBAL LIGATION  1999     Allergies  Allergen Reactions   Other Hives    Peaches      Family History  Problem Relation Age of Onset   Diabetes Father    Cancer Maternal Grandfather    Heart disease Maternal Grandmother      Social History Ms. Westermeyer reports that she has never smoked. She has quit using smokeless  tobacco. Ms. Bega reports no history of alcohol use.   Review of Systems CONSTITUTIONAL: No weight loss, fever, chills, weakness or fatigue.  HEENT: Eyes: No visual loss, blurred vision, double vision or yellow sclerae.No hearing loss, sneezing,  congestion, runny nose or sore throat.  SKIN: No rash or itching.  CARDIOVASCULAR: per hpi RESPIRATORY: No shortness of breath, cough or sputum.  GASTROINTESTINAL: No anorexia, nausea, vomiting or diarrhea. No abdominal pain or blood.  GENITOURINARY: No burning on urination, no polyuria NEUROLOGICAL: No headache, dizziness, syncope, paralysis, ataxia, numbness or tingling in the extremities. No change in bowel or bladder control.  MUSCULOSKELETAL: No muscle, back pain, joint pain or stiffness.  LYMPHATICS: No enlarged nodes. No history of splenectomy.  PSYCHIATRIC: No history of depression or anxiety.  ENDOCRINOLOGIC: No reports of sweating, cold or heat intolerance. No polyuria or polydipsia.  Marland Kitchen   Physical Examination Today's Vitals   12/25/20 1326  BP: 140/82  Pulse: 85  SpO2: 98%  Weight: (!) 372 lb (168.7 kg)  Height: 5\' 9"  (1.753 m)   Body mass index is 54.93 kg/m.  Gen: resting comfortably, no acute distress HEENT: no scleral icterus, pupils equal round and reactive, no palptable cervical adenopathy,  CV: RRR, no m/rg, no jvd Resp: Clear to auscultation bilaterally GI: abdomen is soft, non-tender, non-distended, normal bowel sounds, no hepatosplenomegaly MSK: extremities are warm, no edema.  Skin: warm, no rash Neuro:  no focal deficits Psych: appropriate affect     Assessment and Plan  Noncardiac chest pain - symptoms are noncardiac, no further testing is indicated   2. LE edema - overall controlled. Mildly elevated BNP in ER - prior echo showed normal LVEF, fairly normal diastolic measured parameters but with modrate LVH would suspect at least some component of dysfunction.  - continue diuretic   F/u just as  needed.       Arnoldo Lenis, M.D.

## 2021-01-23 DIAGNOSIS — H5213 Myopia, bilateral: Secondary | ICD-10-CM | POA: Diagnosis not present

## 2021-01-23 DIAGNOSIS — H524 Presbyopia: Secondary | ICD-10-CM | POA: Diagnosis not present

## 2021-02-22 DIAGNOSIS — M170 Bilateral primary osteoarthritis of knee: Secondary | ICD-10-CM | POA: Diagnosis not present

## 2021-02-22 DIAGNOSIS — K2100 Gastro-esophageal reflux disease with esophagitis, without bleeding: Secondary | ICD-10-CM | POA: Diagnosis not present

## 2021-02-22 DIAGNOSIS — I10 Essential (primary) hypertension: Secondary | ICD-10-CM | POA: Diagnosis not present

## 2021-02-26 ENCOUNTER — Other Ambulatory Visit: Payer: Self-pay | Admitting: General Surgery

## 2021-02-26 ENCOUNTER — Other Ambulatory Visit (HOSPITAL_COMMUNITY): Payer: Self-pay | Admitting: General Surgery

## 2021-02-26 DIAGNOSIS — K2100 Gastro-esophageal reflux disease with esophagitis, without bleeding: Secondary | ICD-10-CM

## 2021-02-27 DIAGNOSIS — M170 Bilateral primary osteoarthritis of knee: Secondary | ICD-10-CM | POA: Diagnosis not present

## 2021-02-27 DIAGNOSIS — K219 Gastro-esophageal reflux disease without esophagitis: Secondary | ICD-10-CM | POA: Diagnosis not present

## 2021-02-27 DIAGNOSIS — I10 Essential (primary) hypertension: Secondary | ICD-10-CM | POA: Diagnosis not present

## 2021-02-27 DIAGNOSIS — K2100 Gastro-esophageal reflux disease with esophagitis, without bleeding: Secondary | ICD-10-CM | POA: Diagnosis not present

## 2021-02-27 DIAGNOSIS — E569 Vitamin deficiency, unspecified: Secondary | ICD-10-CM | POA: Diagnosis not present

## 2021-02-27 DIAGNOSIS — G4733 Obstructive sleep apnea (adult) (pediatric): Secondary | ICD-10-CM | POA: Diagnosis not present

## 2021-02-27 DIAGNOSIS — Z1211 Encounter for screening for malignant neoplasm of colon: Secondary | ICD-10-CM | POA: Diagnosis not present

## 2021-02-27 DIAGNOSIS — Z1231 Encounter for screening mammogram for malignant neoplasm of breast: Secondary | ICD-10-CM | POA: Diagnosis not present

## 2021-02-27 DIAGNOSIS — R0609 Other forms of dyspnea: Secondary | ICD-10-CM | POA: Diagnosis not present

## 2021-03-06 ENCOUNTER — Encounter (HOSPITAL_COMMUNITY): Payer: Self-pay

## 2021-03-06 ENCOUNTER — Ambulatory Visit (HOSPITAL_COMMUNITY): Payer: Medicare HMO

## 2021-03-06 ENCOUNTER — Ambulatory Visit (HOSPITAL_COMMUNITY): Admission: RE | Admit: 2021-03-06 | Payer: Medicare Other | Source: Ambulatory Visit

## 2021-03-12 DIAGNOSIS — Z1211 Encounter for screening for malignant neoplasm of colon: Secondary | ICD-10-CM | POA: Diagnosis not present

## 2021-03-15 DIAGNOSIS — M170 Bilateral primary osteoarthritis of knee: Secondary | ICD-10-CM | POA: Diagnosis not present

## 2021-03-18 LAB — EXTERNAL GENERIC LAB PROCEDURE: COLOGUARD: NEGATIVE

## 2021-04-04 ENCOUNTER — Ambulatory Visit: Payer: Medicare Other | Admitting: Skilled Nursing Facility1

## 2021-05-10 ENCOUNTER — Ambulatory Visit (HOSPITAL_COMMUNITY): Admission: RE | Admit: 2021-05-10 | Payer: Medicare Other | Source: Ambulatory Visit

## 2021-05-17 ENCOUNTER — Encounter: Payer: Medicare Other | Attending: General Surgery | Admitting: Skilled Nursing Facility1

## 2021-05-17 ENCOUNTER — Other Ambulatory Visit: Payer: Self-pay

## 2021-05-17 NOTE — Progress Notes (Addendum)
Nutrition Assessment for Bariatric Surgery Medical Nutrition Therapy Appt Start Time: 10:45    End Time: 12:00  Patient was seen on 05/17/2021 for Pre-Operative Nutrition Assessment. Letter of approval faxed to [facility] bariatric surgery program coordinator on 05/17/2021.   Referral stated Supervised Weight Loss (SWL) visits needed: 6; to be done with RD in PCP office   Planned surgery: Referral states RYGB pt states that was switched to the Sleeve Pt expectation of surgery: to lose weight Pt expectation of dietitian: none identified    NUTRITION ASSESSMENT   Anthropometrics  Start weight at [facility]: 340 lbs (date: 05/17/2021)  Height: 69 in BMI: 50.21 kg/m2     Clinical  Medical hx: GERD, anxiety, depression, arthritis, asthma, A-fib, bipolar,  Medications: see list  Labs: creatinine 1.19, GFR 55, LDL 181 Notable signs/symptoms: vertigo, passing out throughout the day Any previous deficiencies? No  Micronutrient Nutrition Focused Physical Exam: Hair: No issues observed Eyes: No issues observed Mouth: No issues observed Neck: No issues observed Nails: No issues observed Skin: No issues observed  Lifestyle & Dietary Hx  Pt states she has been eating pureed chicken which she likes. Pt states her neighbor has been getting her to try different foods when before she was unwilling to try new foods Pt state she does not eat too much she could not possible eat what is needed. Pt states she sees food on the TV and feels she has to eat it and realizes she does bored eat.  Pt states she has a nurse with her until about 12pm 6 days a week.   When pt was showed what a healthy balanced meal would look like after surgery pt states she cannot eat that much food    Pt states she does pass out often stating when she gets upset or frustrated she will pass out stating vertigo runs in her family. Pt states she does have vertigo.   Pt states she chokes on her food often leading  to emesis several times a week.   Dietitian spent 40 minutes educating on calories verses volume, calorically dense foods verses nutrient dense foods with the use of colored pictures and tactile models.  24-Hr Dietary Recall First Meal: frozen meal jimmy deans: egg and sausage + applesauce or cereal Snack:  Second Meal: strawberry cream cheese and toast Snack: cake and ice cream Third Meal: cake and ice cream Snack:  Beverages: water + flavoring   Estimated Energy Needs Calories: 1500   NUTRITION DIAGNOSIS  Overweight/obesity ([place]-3.3) related to past poor dietary habits and physical inactivity as evidenced by patient w/ planned sleeve gastrectomy surgery following dietary guidelines for continued weight loss.    NUTRITION INTERVENTION  Nutrition counseling (C-1) and education (E-2) to facilitate bariatric surgery goals.  Educated pt on micronutrient deficiencies post surgery and strategies to mitigate that risk   Pre-Op Goals Reviewed with the Patient Track food and beverage intake (pen and paper, MyFitness Pal, Baritastic app, etc.) Make healthy food choices while monitoring portion sizes Consume 3 meals per day or try to eat every 3-5 hours Avoid concentrated sugars and fried foods Keep sugar & fat in the single digits per serving on food labels Practice CHEWING your food (aim for applesauce consistency) Practice not drinking 15 minutes before, during, and 30 minutes after each meal and snack Avoid all carbonated beverages (ex: soda, sparkling beverages)  Limit caffeinated beverages (ex: coffee, tea, energy drinks) Avoid all sugar-sweetened beverages (ex: regular soda, sports drinks)  Avoid alcohol  Aim for 64-100 ounces of FLUID daily (with at least half of fluid intake being plain water)  Aim for at least 60-80 grams of PROTEIN daily Look for a liquid protein source that contains ?15 g protein and ?5 g carbohydrate (ex: shakes, drinks, shots) Make a list of non-food  related activities Physical activity is an important part of a healthy lifestyle so keep it moving! The goal is to reach 150 minutes of exercise per week, including cardiovascular and weight baring activity. Speak with your dietitian about your dysphagia and how to cook foods using moist cooking methods  *Goals that are bolded indicate the pt would like to start working towards these  Handouts Provided Include  Bariatric Surgery handouts (Nutrition Visits, Pre-Op Goals, Protein Shakes, Vitamins & Minerals) Detailed MyPlate  Learning Style & Readiness for Change Teaching method utilized: Visual & Auditory  Demonstrated degree of understanding via: Teach Back  Readiness Level: pre-contemplative Barriers to learning/adherence to lifestyle change: unidentified    MONITORING & EVALUATION Dietary intake, weekly physical activity, body weight, and pre-op goals reached at next nutrition visit.    Next Steps  Patient is to follow up at [facility] for Pre-Op Class >2 weeks before surgery for further nutrition education.

## 2021-06-11 DIAGNOSIS — G43009 Migraine without aura, not intractable, without status migrainosus: Secondary | ICD-10-CM | POA: Diagnosis not present

## 2021-06-11 DIAGNOSIS — Z1211 Encounter for screening for malignant neoplasm of colon: Secondary | ICD-10-CM | POA: Diagnosis not present

## 2021-06-11 DIAGNOSIS — J4540 Moderate persistent asthma, uncomplicated: Secondary | ICD-10-CM | POA: Diagnosis not present

## 2021-06-11 DIAGNOSIS — H538 Other visual disturbances: Secondary | ICD-10-CM | POA: Diagnosis not present

## 2021-06-11 DIAGNOSIS — R200 Anesthesia of skin: Secondary | ICD-10-CM | POA: Diagnosis not present

## 2021-06-11 DIAGNOSIS — K219 Gastro-esophageal reflux disease without esophagitis: Secondary | ICD-10-CM | POA: Diagnosis not present

## 2021-06-11 DIAGNOSIS — I10 Essential (primary) hypertension: Secondary | ICD-10-CM | POA: Diagnosis not present

## 2021-06-11 DIAGNOSIS — Z79899 Other long term (current) drug therapy: Secondary | ICD-10-CM | POA: Diagnosis not present

## 2021-06-11 DIAGNOSIS — E7800 Pure hypercholesterolemia, unspecified: Secondary | ICD-10-CM | POA: Diagnosis not present

## 2021-06-11 DIAGNOSIS — M5030 Other cervical disc degeneration, unspecified cervical region: Secondary | ICD-10-CM | POA: Diagnosis not present

## 2021-06-26 DIAGNOSIS — R7401 Elevation of levels of liver transaminase levels: Secondary | ICD-10-CM | POA: Diagnosis not present

## 2021-07-16 ENCOUNTER — Other Ambulatory Visit: Payer: Self-pay | Admitting: Neurosurgery

## 2021-07-16 ENCOUNTER — Other Ambulatory Visit (HOSPITAL_COMMUNITY): Payer: Self-pay | Admitting: Neurosurgery

## 2021-07-16 DIAGNOSIS — M542 Cervicalgia: Secondary | ICD-10-CM

## 2021-08-01 ENCOUNTER — Encounter (HOSPITAL_COMMUNITY): Payer: Self-pay

## 2021-08-01 ENCOUNTER — Ambulatory Visit (HOSPITAL_COMMUNITY): Payer: Medicaid Other

## 2021-08-01 DIAGNOSIS — U071 COVID-19: Secondary | ICD-10-CM | POA: Diagnosis not present

## 2021-08-19 DIAGNOSIS — G8929 Other chronic pain: Secondary | ICD-10-CM | POA: Diagnosis not present

## 2021-08-19 DIAGNOSIS — I10 Essential (primary) hypertension: Secondary | ICD-10-CM | POA: Diagnosis not present

## 2021-08-19 DIAGNOSIS — Z981 Arthrodesis status: Secondary | ICD-10-CM | POA: Diagnosis not present

## 2021-08-19 DIAGNOSIS — M5030 Other cervical disc degeneration, unspecified cervical region: Secondary | ICD-10-CM | POA: Diagnosis not present

## 2021-08-19 DIAGNOSIS — M25511 Pain in right shoulder: Secondary | ICD-10-CM | POA: Diagnosis not present

## 2021-08-19 DIAGNOSIS — M542 Cervicalgia: Secondary | ICD-10-CM | POA: Diagnosis not present

## 2021-08-19 DIAGNOSIS — M47812 Spondylosis without myelopathy or radiculopathy, cervical region: Secondary | ICD-10-CM | POA: Diagnosis not present

## 2021-08-19 DIAGNOSIS — Z79899 Other long term (current) drug therapy: Secondary | ICD-10-CM | POA: Diagnosis not present

## 2021-08-29 ENCOUNTER — Other Ambulatory Visit: Payer: Self-pay

## 2021-08-29 ENCOUNTER — Ambulatory Visit (HOSPITAL_COMMUNITY)
Admission: RE | Admit: 2021-08-29 | Discharge: 2021-08-29 | Disposition: A | Discharge status: Home / Self Care | Payer: Medicare Other | Source: Ambulatory Visit | Attending: Neurosurgery | Admitting: Neurosurgery

## 2021-08-29 DIAGNOSIS — M542 Cervicalgia: Secondary | ICD-10-CM | POA: Diagnosis not present

## 2021-08-29 DIAGNOSIS — M4802 Spinal stenosis, cervical region: Secondary | ICD-10-CM | POA: Diagnosis not present

## 2021-09-12 DIAGNOSIS — I10 Essential (primary) hypertension: Secondary | ICD-10-CM | POA: Diagnosis not present

## 2021-09-12 DIAGNOSIS — R0602 Shortness of breath: Secondary | ICD-10-CM | POA: Diagnosis not present

## 2021-09-12 DIAGNOSIS — J45901 Unspecified asthma with (acute) exacerbation: Secondary | ICD-10-CM | POA: Diagnosis not present

## 2021-09-12 DIAGNOSIS — M5412 Radiculopathy, cervical region: Secondary | ICD-10-CM | POA: Diagnosis not present

## 2021-09-12 DIAGNOSIS — Z20822 Contact with and (suspected) exposure to covid-19: Secondary | ICD-10-CM | POA: Diagnosis not present

## 2021-09-12 DIAGNOSIS — G542 Cervical root disorders, not elsewhere classified: Secondary | ICD-10-CM | POA: Diagnosis not present

## 2021-09-12 DIAGNOSIS — M4722 Other spondylosis with radiculopathy, cervical region: Secondary | ICD-10-CM | POA: Diagnosis not present

## 2021-09-19 DIAGNOSIS — J4540 Moderate persistent asthma, uncomplicated: Secondary | ICD-10-CM | POA: Diagnosis not present

## 2021-09-19 DIAGNOSIS — R7401 Elevation of levels of liver transaminase levels: Secondary | ICD-10-CM | POA: Diagnosis not present

## 2021-09-19 DIAGNOSIS — Z8673 Personal history of transient ischemic attack (TIA), and cerebral infarction without residual deficits: Secondary | ICD-10-CM | POA: Diagnosis not present

## 2021-09-19 DIAGNOSIS — M2550 Pain in unspecified joint: Secondary | ICD-10-CM | POA: Diagnosis not present

## 2021-09-19 DIAGNOSIS — K219 Gastro-esophageal reflux disease without esophagitis: Secondary | ICD-10-CM | POA: Diagnosis not present

## 2021-09-19 DIAGNOSIS — G4733 Obstructive sleep apnea (adult) (pediatric): Secondary | ICD-10-CM | POA: Diagnosis not present

## 2021-09-25 ENCOUNTER — Other Ambulatory Visit: Payer: Self-pay | Admitting: Family Medicine

## 2021-09-25 DIAGNOSIS — R7401 Elevation of levels of liver transaminase levels: Secondary | ICD-10-CM

## 2021-09-26 DIAGNOSIS — M4722 Other spondylosis with radiculopathy, cervical region: Secondary | ICD-10-CM | POA: Diagnosis not present

## 2021-09-26 DIAGNOSIS — M5090 Cervical disc disorder, unspecified, unspecified cervical region: Secondary | ICD-10-CM | POA: Diagnosis not present

## 2021-10-04 ENCOUNTER — Other Ambulatory Visit: Payer: Medicare Other

## 2021-10-10 ENCOUNTER — Ambulatory Visit
Admission: RE | Admit: 2021-10-10 | Discharge: 2021-10-10 | Disposition: A | Discharge status: Home / Self Care | Payer: Medicare Other | Source: Ambulatory Visit | Attending: Family Medicine | Admitting: Family Medicine

## 2021-10-10 DIAGNOSIS — R748 Abnormal levels of other serum enzymes: Secondary | ICD-10-CM | POA: Diagnosis not present

## 2021-10-10 DIAGNOSIS — R7401 Elevation of levels of liver transaminase levels: Secondary | ICD-10-CM

## 2021-10-31 DIAGNOSIS — M5412 Radiculopathy, cervical region: Secondary | ICD-10-CM | POA: Diagnosis not present

## 2021-11-19 DIAGNOSIS — M542 Cervicalgia: Secondary | ICD-10-CM | POA: Diagnosis not present

## 2021-11-21 DIAGNOSIS — M4722 Other spondylosis with radiculopathy, cervical region: Secondary | ICD-10-CM | POA: Diagnosis not present

## 2021-12-10 ENCOUNTER — Other Ambulatory Visit: Payer: Self-pay | Admitting: Neurosurgery

## 2021-12-18 ENCOUNTER — Other Ambulatory Visit: Payer: Self-pay | Admitting: Neurosurgery

## 2021-12-19 ENCOUNTER — Encounter (HOSPITAL_COMMUNITY)
Admission: RE | Admit: 2021-12-19 | Discharge: 2021-12-19 | Disposition: A | Discharge status: Home / Self Care | Payer: Medicare Other | Source: Ambulatory Visit | Attending: Neurosurgery | Admitting: Neurosurgery

## 2021-12-19 ENCOUNTER — Encounter (HOSPITAL_COMMUNITY): Payer: Self-pay

## 2021-12-19 ENCOUNTER — Other Ambulatory Visit: Payer: Self-pay

## 2021-12-19 VITALS — HR 81 | Temp 98.4°F | Resp 18 | Ht 69.0 in | Wt 368.1 lb

## 2021-12-19 DIAGNOSIS — K219 Gastro-esophageal reflux disease without esophagitis: Secondary | ICD-10-CM | POA: Diagnosis not present

## 2021-12-19 DIAGNOSIS — J45909 Unspecified asthma, uncomplicated: Secondary | ICD-10-CM | POA: Insufficient documentation

## 2021-12-19 DIAGNOSIS — Z6841 Body Mass Index (BMI) 40.0 and over, adult: Secondary | ICD-10-CM | POA: Insufficient documentation

## 2021-12-19 DIAGNOSIS — G4733 Obstructive sleep apnea (adult) (pediatric): Secondary | ICD-10-CM | POA: Diagnosis not present

## 2021-12-19 DIAGNOSIS — F319 Bipolar disorder, unspecified: Secondary | ICD-10-CM | POA: Insufficient documentation

## 2021-12-19 DIAGNOSIS — M4722 Other spondylosis with radiculopathy, cervical region: Secondary | ICD-10-CM | POA: Insufficient documentation

## 2021-12-19 DIAGNOSIS — Z01812 Encounter for preprocedural laboratory examination: Secondary | ICD-10-CM | POA: Diagnosis not present

## 2021-12-19 DIAGNOSIS — F419 Anxiety disorder, unspecified: Secondary | ICD-10-CM | POA: Insufficient documentation

## 2021-12-19 DIAGNOSIS — M50123 Cervical disc disorder at C6-C7 level with radiculopathy: Secondary | ICD-10-CM | POA: Diagnosis not present

## 2021-12-19 DIAGNOSIS — I10 Essential (primary) hypertension: Secondary | ICD-10-CM | POA: Insufficient documentation

## 2021-12-19 DIAGNOSIS — Z01818 Encounter for other preprocedural examination: Secondary | ICD-10-CM

## 2021-12-19 HISTORY — DX: Angina pectoris, unspecified: I20.9

## 2021-12-19 HISTORY — DX: Depression, unspecified: F32.A

## 2021-12-19 HISTORY — DX: Anemia, unspecified: D64.9

## 2021-12-19 LAB — BASIC METABOLIC PANEL
Anion gap: 6 (ref 5–15)
BUN: 15 mg/dL (ref 6–20)
CO2: 21 mmol/L — ABNORMAL LOW (ref 22–32)
Calcium: 9.3 mg/dL (ref 8.9–10.3)
Chloride: 109 mmol/L (ref 98–111)
Creatinine, Ser: 1.04 mg/dL — ABNORMAL HIGH (ref 0.44–1.00)
GFR, Estimated: >60 mL/min (ref >60)
Glucose, Bld: 82 mg/dL (ref 70–99)
Potassium: 4.3 mmol/L (ref 3.5–5.1)
Sodium: 136 mmol/L (ref 135–145)

## 2021-12-19 LAB — SURGICAL PCR SCREEN
MRSA, PCR: NEGATIVE
Staphylococcus aureus: POSITIVE — AB

## 2021-12-19 LAB — TYPE AND SCREEN
ABO/RH(D): O POS
Antibody Screen: NEGATIVE

## 2021-12-19 LAB — CBC
HCT: 44.4 % (ref 36.0–46.0)
Hemoglobin: 14.5 g/dL (ref 12.0–15.0)
MCH: 30.9 pg (ref 26.0–34.0)
MCHC: 32.7 g/dL (ref 30.0–36.0)
MCV: 94.5 fL (ref 80.0–100.0)
Platelets: 301 10*3/uL (ref 150–400)
RBC: 4.7 MIL/uL (ref 3.87–5.11)
RDW: 13.4 % (ref 11.5–15.5)
WBC: 7.4 10*3/uL (ref 4.0–10.5)
nRBC: 0 % (ref 0.0–0.2)

## 2021-12-19 NOTE — Progress Notes (Signed)
PCP - Dr. Lauretta Grill [place] Cardiologist - Saw Dr. Johnsie Cancel and Dr. Harl Bowie for r/o CP but it was not related to heart issues. It was respiratory related  PPM/ICD - Denies Device Orders - n/a Rep Notified - n/a  Chest x-ray - 08/18/2018 EKG - 09/12/2021 - Tracing requested from [facility] Stress Test - Denies ECHO - 04/28/2018 Cardiac Cath - Denies  Sleep Study - Per patient, had one in 2017 CPAP - Per patient, she wasn't wearing/using it correctly so they "took away" her CPAP  No DM  Blood Thinner Instructions: n/a Aspirin Instructions: n/a  NPO after midnight  COVID TEST- n/a   Anesthesia review: No.  Patient denies shortness of breath, fever, cough and chest pain at PAT appointment   All instructions explained to the patient, with a verbal understanding of the material. Patient agrees to go over the instructions while at home for a better understanding. Patient also instructed to self quarantine after being tested for COVID-19. The opportunity to ask questions was provided.

## 2021-12-19 NOTE — Pre-Procedure Instructions (Addendum)
Surgical Instructions    Your procedure is scheduled on December 25, 2021.  Report to [facility] Main Entrance "A" at 5:30 A.M., then check in with the Admitting office.  Call this number if you have problems the morning of surgery:  [phone]   If you have any questions prior to your surgery date call [phone]: Open Monday-Friday 8am-4pm    Remember:  Do not eat or drink after midnight the night before your surgery    Take these medicines the morning of surgery with A SIP OF WATER:  amLODipine (NORVASC)  BREO ELLIPTA   buPROPion (WELLBUTRIN XL)  FLUoxetine (PROZAC)   gabapentin (NEURONTIN)  metoprolol succinate (TOPROL-XL)   omeprazole (PRILOSEC)  tiZANidine (ZANAFLEX)  topiramate (TOPAMAX)  norethindrone (AYGESTIN)    Take these medicines the morning of surgery AS NEEDED:  albuterol (PROVENTIL HFA;VENTOLIN HFA - please bring with you to hospital  carboxymethylcellulose (REFRESH PLUS)  ondansetron (ZOFRAN)  SUMAtriptan (IMITREX)  traMADol (ULTRAM)  fluticasone (FLONASE)   As of today, STOP taking any Aspirin (unless otherwise instructed by your surgeon) Aleve, Naproxen, Ibuprofen, Motrin, Advil, Goody's, BC's, all herbal medications, fish oil, and all vitamins. This includes your diclofenac Sodium (VOLTAREN)                      Do NOT Smoke (Tobacco/Vaping) for 24 hours prior to your procedure.  If you use a CPAP at night, you may bring your mask/headgear for your overnight stay.   Contacts, glasses, piercing's, hearing aid's, dentures or partials may not be worn into surgery, please bring cases for these belongings.    For patients admitted to the hospital, discharge time will be determined by your treatment team.   Patients discharged the day of surgery will not be allowed to drive home, and someone needs to stay with them for 24 hours.  SURGICAL WAITING ROOM VISITATION Patients having surgery or a procedure may have two support people in the waiting room. These  visitors may be switched out with other visitors if needed. Children under the age of 5 must have an adult accompany them who is not the patient. If the patient needs to stay at the hospital during part of their recovery, the visitor guidelines for inpatient rooms apply.  Please refer to the [facility] website for the visitor guidelines for Inpatients (after your surgery is over and you are in a regular room).    Special instructions:   [place]- Preparing For Surgery  Before surgery, you can play an important role. Because skin is not sterile, your skin needs to be as free of germs as possible. You can reduce the number of germs on your skin by washing with CHG (chlorahexidine gluconate) Soap before surgery.  CHG is an antiseptic cleaner which kills germs and bonds with the skin to continue killing germs even after washing.    Oral Hygiene is also important to reduce your risk of infection.  Remember - BRUSH YOUR TEETH THE MORNING OF SURGERY WITH YOUR REGULAR TOOTHPASTE  Please do not use if you have an allergy to CHG or antibacterial soaps. If your skin becomes reddened/irritated stop using the CHG.  Do not shave (including legs and underarms) for at least 48 hours prior to first CHG shower. It is OK to shave your face.  Please follow these instructions carefully.   Shower the NIGHT BEFORE SURGERY and the MORNING OF SURGERY  If you chose to wash your hair, wash your hair first as usual  with your normal shampoo.  After you shampoo, rinse your hair and body thoroughly to remove the shampoo.  Use CHG Soap as you would any other liquid soap. You can apply CHG directly to the skin and wash gently with a scrungie or a clean washcloth.   Apply the CHG Soap to your body ONLY FROM THE NECK DOWN.  Do not use on open wounds or open sores. Avoid contact with your eyes, ears, mouth and genitals (private parts). Wash Face and genitals (private parts)  with your normal soap.   Wash thoroughly,  paying special attention to the area where your surgery will be performed.  Thoroughly rinse your body with warm water from the neck down.  DO NOT shower/wash with your normal soap after using and rinsing off the CHG Soap.  Pat yourself dry with a CLEAN TOWEL.  Wear CLEAN PAJAMAS to bed the night before surgery  Place CLEAN SHEETS on your bed the night before your surgery  DO NOT SLEEP WITH PETS.   Day of Surgery: Take a shower with CHG soap.  Do not wear jewelry or makeup Do not wear lotions, powders, perfumes/colognes, or deodorant. Do not shave 48 hours prior to surgery.   Do not bring valuables to the hospital.  [facility] is not responsible for any belongings or valuables. Do not wear nail polish, gel polish, artificial nails, or any other type of covering on natural nails (fingers and toes) If you have artificial nails or gel coating that need to be removed by a nail salon, please have this removed prior to surgery. Artificial nails or gel coating may interfere with anesthesia's ability to adequately monitor your vital signs.  Wear Clean/Comfortable clothing the morning of surgery  Remember to brush your teeth WITH YOUR REGULAR TOOTHPASTE.   Please read over the following fact sheets that you were given.    If you received a COVID test during your pre-op visit  it is requested that you wear a mask when out in public, stay away from anyone that may not be feeling well and notify your surgeon if you develop symptoms. If you have been in contact with anyone that has tested positive in the last 10 days please notify you surgeon.

## 2021-12-20 ENCOUNTER — Encounter (HOSPITAL_COMMUNITY): Payer: Self-pay

## 2021-12-20 NOTE — Anesthesia Preprocedure Evaluation (Addendum)
Anesthesia Evaluation  Patient identified by MRN, date of birth, ID band Patient awake    Reviewed: Allergy & Precautions, NPO status , Patient's Chart, lab work & pertinent test results, reviewed documented beta blocker date and time   History of Anesthesia Complications Negative for: history of anesthetic complications  Airway Mallampati: II  TM Distance: >3 FB Neck ROM: Full    Dental  (+) Dental Advisory Given   Pulmonary asthma , sleep apnea , COPD,  COPD inhaler,    breath sounds clear to auscultation       Cardiovascular hypertension, Pt. on medications and Pt. on home beta blockers (-) angina+ dysrhythmias  Rhythm:Regular Rate:Normal  '19 ECHO: EF 60-65%, normal LVF, no significant valvular abnormalities   Neuro/Psych  Headaches, Anxiety Depression Bipolar Disorder    GI/Hepatic Neg liver ROS, GERD  Controlled,  Endo/Other  Morbid obesity  Renal/GU negative Renal ROS     Musculoskeletal  (+) Arthritis ,   Abdominal (+) + obese,   Peds  Hematology negative hematology ROS (+)   Anesthesia Other Findings   Reproductive/Obstetrics                           Anesthesia Physical Anesthesia Plan  ASA: 3  Anesthesia Plan: General   Post-op Pain Management: Tylenol PO (pre-op)*   Induction: Intravenous  PONV Risk Score and Plan: 3 and Ondansetron, Dexamethasone and Scopolamine patch - Pre-op  Airway Management Planned: Oral ETT and Video Laryngoscope Planned  Additional Equipment: None  Intra-op Plan:   Post-operative Plan: Extubation in OR  Informed Consent: I have reviewed the patients History and Physical, chart, labs and discussed the procedure including the risks, benefits and alternatives for the proposed anesthesia with the patient or authorized representative who has indicated his/her understanding and acceptance.     Dental advisory given  Plan Discussed with: CRNA  and Surgeon  Anesthesia Plan Comments: (PAT note written 12/20/2021 by Shonna Chock, PA-C. )      Anesthesia Quick Evaluation

## 2021-12-20 NOTE — Progress Notes (Signed)
Anesthesia Chart Review:  Case: [id number] Date/Time: 12/25/21 0715   Procedure: ACDF C56, C67 - 3C   Anesthesia type: General   Pre-op diagnosis: CERVICAL SPONDYLOSIS WITH RADICULOPATHY   Location: [facility] OR ROOM 21 / [facility]   Surgeons: Consuella Lose, MD       DISCUSSION: Patient is a 51 year old female scheduled for the above procedure.  History includes never smoker, HTN, asthma (exacerbation 09/12/21), GERD, chest pain (10/2020; atypical, no testing recommended 12/25/20), migraines, uterine fibroids (endometrial ablation 04/24/10), bipolar disorder, exertional dyspnea, OSA (mild 2018 HST), anxiety, anemia, spinal surgery (C3-5 ACDF 01/23/18), morbid obesity. Afib is listed in history from [facility] visit 01/31/16 (was not in afib by EKG and did not find evidence to support history),and no details in 04/16/18 or 12/25/20 cardiology notes that recommended as needed follow-up. 09/12/21 EKG showed NSR.  She was evaluated by cardiologist Dr. Harl Bowie on 12/25/2020 following an ED visit at [facility] on 11/03/2020 for chest pain, primarily right shoulder and right upper back and worse with movement.  Symptoms felt noncardiac, and no further cardiac testing recommended. As needed cardiology follow-up planned. He did note she had some lower extremity edema but overall controlled.  BMP mildly elevated in the past with normal LVEF, moderate LVH, and fairly normal diastolic parameters on prior echo but suspected she had at least some component of diastolic dysfunction.  Continue diuretic therapy.  Anesthesia team to evaluate on the day of surgery.    VS: Pulse 81   Temp 36.9 C (Oral)   Resp 18   Ht '5\' 9"'$  (1.753 m)   Wt (!) 167 kg   LMP 12/04/2021   SpO2 100%   BMI 54.36 kg/m  No BP is recorded.    PROVIDERS: Koirala, Dibas, MD is PCP    LABS: Labs reviewed: Acceptable for surgery. (all labs ordered are listed, but only abnormal results are displayed)  Labs Reviewed  SURGICAL PCR SCREEN - Abnormal;  Notable for the following components:      Result Value   Staphylococcus aureus POSITIVE (*)    All other components within normal limits  BASIC METABOLIC PANEL - Abnormal; Notable for the following components:   CO2 21 (*)    Creatinine, Ser 1.04 (*)    All other components within normal limits  CBC  TYPE AND SCREEN    OTHER: PFTs 08/11/17: FVC 2.77 (84%), post 2.78 (84%). FEV1 1.91 (72%), post 2.04 (77%), DLCO unc 30.70 (108%).   Home Sleep Study 08/28/16: IMPRESSIONS - Mild obstructive sleep apnea occurred during this study (AHI = 5.5/h). - No significant central sleep apnea occurred during this study (CAI = 0.0/h). - Moderate oxygen desaturation was noted during this study (Min O2 = 86%). - Patient snored.   IMAGES: [place] Abd 10/10/21: IMPRESSION: No significant abnormalities are identified.   CXR 09/12/21 [facility]): FINDINGS:  The heart size and mediastinal contours are within normal limits.  Mild atherosclerotic calcification of aortic arch. Both lungs are  clear. The visualized skeletal structures are unremarkable. IMPRESSION: No active disease.  MRI C-spine 08/29/21: IMPRESSION: 1. Motion degraded examination. 2. Right foraminal disc protrusion at C6-7 with severe right neural foraminal stenosis and right C7 nerve root impingement. 3. Mild spinal stenosis and moderate right and mild-to-moderate left neural foraminal stenosis at C5-6. 4. Prior C3-C5 ACDF.    EKG: 09/12/21 [facility]): NSR   CV: Echo 04/28/18: Study Conclusions  - Left ventricle: The cavity size was normal. Wall thickness was    increased  in a pattern of moderate LVH. Systolic function was    normal. The estimated ejection fraction was in the range of 60%    to 65%. Wall motion was normal; there were no regional wall    motion abnormalities. Left ventricular diastolic function    parameters were normal for the patient&'s age.   US Carotid 06/03/16: IMPRESSION: - Normal carotid examination.  No significant plaque or stenosis in the carotid arteries. - Patent vertebral arteries with antegrade flow.    Past Medical History:  Diagnosis Date   Abnormal Pap smear    Anemia    Anxiety    Asthma    Atrial fibrillation (HCC)    Bipolar disorder (HCC)    BV (bacterial vaginosis) 08/24/2010   Cervical radiculopathy 01/23/2018   Cough variant asthma 10/18/2011   05/29/2017  After extensive coaching HFA effectiveness =    90% > continue symb 160 2bid - Allergy profile 05/29/17  >  Eos 0.6 /  IgE  172  RAST pos mild mold only  - FENO 05/29/2017  =   36 -   08/11/2017  After extensive coaching inhaler device  effectiveness =    75% > continue symicort 160  2bid/ singulair  - PFT's  08/11/2017  FEV1 2.04 (77 % ) ratio 73  p 7 % improvement from saba p symb/saba   DDD (degenerative disc disease), cervical    Depression    DJD (degenerative joint disease)    DUB (dysfunctional uterine bleeding) 02/08/2010   Dyspnea    Dyspnea on exertion 05/06/2014   Followed in Pulmonary clinic/ [facility]/ [place] - trial off all acei 05/06/2014  > improved 07/20/14 with nl pfts x low erv > try off symbicort  - 05/29/2017  Walked RA x 3 laps @ 185 ft each stopped due to  End of study, fast pace, leg pain and sob and end with sats down to 90%      Endometrial polyp 03/2010   GERD (gastroesophageal reflux disease)    Hx of chlamydia infection 1995   Hx of constipation 10/17/2010   Hx of menorrhagia 03/2010   Hypertension    Inadequate sleep hygiene 08/31/2014   Migraines    Morbid obesity due to excess calories ([place]) 05/31/2017   Obesity    Obesity, morbid (more than 100 lbs over ideal weight or BMI > 40) ([place]) 2011   OSA (obstructive sleep apnea) 08/31/2014   Uterine fibroid 03/2010   Yeast infection     Past Surgical History:  Procedure Laterality Date   ANTERIOR CERVICAL DECOMP/DISCECTOMY FUSION N/A 01/23/2018   Procedure: ANTERIOR CERVICAL DECOMPRESSION/DISCECTOMY FUSION CERVICAL THREE-  CERVICAL FOUR, CERVICAL FOUR- CERVICAL FIVE;  Surgeon: Consuella Lose, MD;  Location: [facility];  Service: Neurosurgery;  Laterality: N/A;   BACK SURGERY     BREAST REDUCTION SURGERY  1999   CARPAL TUNNEL RELEASE     CESAREAN SECTION     HYSTEROSCOPY W/ ENDOMETRIAL ABLATION  2011   KNEE SURGERY Left    fracture   TONSILLECTOMY  2010   TUBAL LIGATION  1999    MEDICATIONS:  albuterol (PROVENTIL HFA;VENTOLIN HFA) 108 (90 BASE) MCG/ACT inhaler   albuterol (PROVENTIL) (2.5 MG/3ML) 0.083% nebulizer solution   amLODipine (NORVASC) 5 MG tablet   BREO ELLIPTA 200-25 MCG/ACT AEPB   budesonide-formoterol (SYMBICORT) 80-4.5 MCG/ACT inhaler   buPROPion (WELLBUTRIN XL) 300 MG 24 hr tablet   carboxymethylcellulose (REFRESH PLUS) 0.5 % SOLN   COLLAGEN PO   diclofenac Sodium (  VOLTAREN) 1 % GEL   ELDERBERRY PO   FLUoxetine (PROZAC) 20 MG tablet   fluticasone (FLONASE) 50 MCG/ACT nasal spray   furosemide (LASIX) 20 MG tablet   gabapentin (NEURONTIN) 600 MG tablet   losartan (COZAAR) 100 MG tablet   Menthol, Topical Analgesic, (ICY HOT EX)   methocarbamol (ROBAXIN) 500 MG tablet   metoprolol succinate (TOPROL-XL) 25 MG 24 hr tablet   montelukast (SINGULAIR) 10 MG tablet   naproxen (NAPROSYN) 500 MG tablet   norethindrone (AYGESTIN) 5 MG tablet   omeprazole (PRILOSEC) 40 MG capsule   ondansetron (ZOFRAN) 4 MG tablet   predniSONE (DELTASONE) 20 MG tablet   SUMAtriptan (IMITREX) 100 MG tablet   tiZANidine (ZANAFLEX) 4 MG tablet   topiramate (TOPAMAX) 100 MG tablet   traMADol (ULTRAM) 50 MG tablet   No current facility-administered medications for this encounter.    Myra Gianotti, PA-C Surgical Short Stay/Anesthesiology [facility] Phone [phone] [facility] Phone [phone] 12/20/2021 6:29 PM

## 2021-12-25 ENCOUNTER — Other Ambulatory Visit: Payer: Self-pay

## 2021-12-25 ENCOUNTER — Encounter (HOSPITAL_COMMUNITY): Payer: Self-pay | Admitting: Neurosurgery

## 2021-12-25 ENCOUNTER — Ambulatory Visit (HOSPITAL_COMMUNITY)
Admission: RE | Disposition: A | Discharge status: Home / Self Care | Payer: Self-pay | Source: Home / Self Care | Attending: Neurosurgery

## 2021-12-25 ENCOUNTER — Observation Stay (HOSPITAL_COMMUNITY)
Admission: RE | Admit: 2021-12-25 | Discharge: 2021-12-25 | Disposition: A | Discharge status: Home / Self Care | Payer: Medicare Other | Attending: Neurosurgery | Admitting: Neurosurgery

## 2021-12-25 ENCOUNTER — Ambulatory Visit (HOSPITAL_BASED_OUTPATIENT_CLINIC_OR_DEPARTMENT_OTHER): Payer: Medicare Other | Admitting: Anesthesiology

## 2021-12-25 ENCOUNTER — Ambulatory Visit (HOSPITAL_COMMUNITY): Payer: Medicare Other | Admitting: Vascular Surgery

## 2021-12-25 ENCOUNTER — Ambulatory Visit (HOSPITAL_COMMUNITY): Payer: Medicare Other

## 2021-12-25 DIAGNOSIS — I4891 Unspecified atrial fibrillation: Secondary | ICD-10-CM | POA: Diagnosis not present

## 2021-12-25 DIAGNOSIS — Z7951 Long term (current) use of inhaled steroids: Secondary | ICD-10-CM | POA: Diagnosis not present

## 2021-12-25 DIAGNOSIS — Z87891 Personal history of nicotine dependence: Secondary | ICD-10-CM | POA: Insufficient documentation

## 2021-12-25 DIAGNOSIS — J45909 Unspecified asthma, uncomplicated: Secondary | ICD-10-CM | POA: Diagnosis not present

## 2021-12-25 DIAGNOSIS — Z981 Arthrodesis status: Secondary | ICD-10-CM | POA: Diagnosis not present

## 2021-12-25 DIAGNOSIS — J449 Chronic obstructive pulmonary disease, unspecified: Secondary | ICD-10-CM | POA: Diagnosis not present

## 2021-12-25 DIAGNOSIS — M4322 Fusion of spine, cervical region: Secondary | ICD-10-CM | POA: Diagnosis not present

## 2021-12-25 DIAGNOSIS — I10 Essential (primary) hypertension: Secondary | ICD-10-CM

## 2021-12-25 DIAGNOSIS — Z79899 Other long term (current) drug therapy: Secondary | ICD-10-CM | POA: Diagnosis not present

## 2021-12-25 DIAGNOSIS — M4722 Other spondylosis with radiculopathy, cervical region: Principal | ICD-10-CM | POA: Diagnosis present

## 2021-12-25 DIAGNOSIS — F418 Other specified anxiety disorders: Secondary | ICD-10-CM | POA: Diagnosis not present

## 2021-12-25 HISTORY — PX: ANTERIOR CERVICAL DECOMP/DISCECTOMY FUSION: SHX1161

## 2021-12-25 LAB — POCT PREGNANCY, URINE: Preg Test, Ur: NEGATIVE

## 2021-12-25 SURGERY — ANTERIOR CERVICAL DECOMPRESSION/DISCECTOMY FUSION 2 LEVELS
Anesthesia: General | Site: Spine Cervical

## 2021-12-25 MED ORDER — ONDANSETRON HCL 4 MG/2ML IJ SOLN: 4.0000 mg | Freq: Four times a day (QID) | INTRAMUSCULAR | Status: DC | PRN

## 2021-12-25 MED ORDER — MEPERIDINE HCL 25 MG/ML IJ SOLN: 6.2500 mg | INTRAMUSCULAR | Status: DC | PRN

## 2021-12-25 MED ORDER — DOCUSATE SODIUM 100 MG PO CAPS
100.0000 mg | ORAL_CAPSULE | Freq: Two times a day (BID) | ORAL | Status: DC
  Administered 2021-12-25: 100 mg via ORAL
  Filled 2021-12-25: qty 1

## 2021-12-25 MED ORDER — PROPOFOL 10 MG/ML IV BOLUS
INTRAVENOUS | Status: DC | PRN
  Administered 2021-12-25: 20 mg via INTRAVENOUS
  Administered 2021-12-25: 130 mg via INTRAVENOUS

## 2021-12-25 MED ORDER — CEFAZOLIN SODIUM-DEXTROSE 2-4 GM/100ML-% IV SOLN
2.0000 g | Freq: Three times a day (TID) | INTRAVENOUS | Status: DC
  Administered 2021-12-25: 2 g via INTRAVENOUS
  Filled 2021-12-25: qty 100

## 2021-12-25 MED ORDER — ONDANSETRON HCL 4 MG PO TABS: 4.0000 mg | ORAL_TABLET | Freq: Three times a day (TID) | ORAL | Status: DC | PRN

## 2021-12-25 MED ORDER — LACTATED RINGERS IV SOLN: INTRAVENOUS | Status: DC | PRN

## 2021-12-25 MED ORDER — ACETAMINOPHEN 650 MG RE SUPP: 650.0000 mg | RECTAL | Status: DC | PRN

## 2021-12-25 MED ORDER — SODIUM CHLORIDE 0.9 % IV SOLN
250.0000 mL | INTRAVENOUS | Status: DC
Start: 2021-12-25 — End: 2021-12-26

## 2021-12-25 MED ORDER — AMLODIPINE BESYLATE 5 MG PO TABS: 5.0000 mg | ORAL_TABLET | Freq: Every day | ORAL | Status: DC

## 2021-12-25 MED ORDER — OXYMETAZOLINE HCL 0.05 % NA SOLN
NASAL | Status: DC | PRN
  Administered 2021-12-25: 2 via NASAL

## 2021-12-25 MED ORDER — FLUTICASONE FUROATE-VILANTEROL 200-25 MCG/ACT IN AEPB
1.0000 | INHALATION_SPRAY | Freq: Every day | RESPIRATORY_TRACT | Status: DC
  Filled 2021-12-25: qty 28

## 2021-12-25 MED ORDER — PHENYLEPHRINE HCL-NACL 20-0.9 MG/250ML-% IV SOLN
INTRAVENOUS | Status: DC | PRN
  Administered 2021-12-25: 25 ug/min via INTRAVENOUS

## 2021-12-25 MED ORDER — MIDAZOLAM HCL 2 MG/2ML IJ SOLN
INTRAMUSCULAR | Status: AC
  Filled 2021-12-25: qty 2

## 2021-12-25 MED ORDER — PROMETHAZINE HCL 25 MG/ML IJ SOLN: 6.2500 mg | INTRAMUSCULAR | Status: DC | PRN

## 2021-12-25 MED ORDER — ACETAMINOPHEN 325 MG PO TABS
650.0000 mg | ORAL_TABLET | ORAL | Status: DC | PRN
  Administered 2021-12-25: 650 mg via ORAL
  Filled 2021-12-25: qty 2

## 2021-12-25 MED ORDER — GABAPENTIN 600 MG PO TABS: 600.0000 mg | ORAL_TABLET | Freq: Two times a day (BID) | ORAL | Status: DC

## 2021-12-25 MED ORDER — THROMBIN 5000 UNITS EX SOLR
CUTANEOUS | Status: AC
  Filled 2021-12-25: qty 5000

## 2021-12-25 MED ORDER — DEXAMETHASONE SODIUM PHOSPHATE 10 MG/ML IJ SOLN
INTRAMUSCULAR | Status: DC | PRN
  Administered 2021-12-25: 10 mg via INTRAVENOUS

## 2021-12-25 MED ORDER — MIDAZOLAM HCL 2 MG/2ML IJ SOLN: 0.5000 mg | Freq: Once | INTRAMUSCULAR | Status: DC | PRN

## 2021-12-25 MED ORDER — SUGAMMADEX SODIUM 200 MG/2ML IV SOLN
INTRAVENOUS | Status: DC | PRN
  Administered 2021-12-25: 250 mg via INTRAVENOUS

## 2021-12-25 MED ORDER — ORAL CARE MOUTH RINSE: 15.0000 mL | Freq: Once | OROMUCOSAL | Status: AC

## 2021-12-25 MED ORDER — PROPOFOL 10 MG/ML IV BOLUS
INTRAVENOUS | Status: AC
  Filled 2021-12-25: qty 20

## 2021-12-25 MED ORDER — LIDOCAINE-EPINEPHRINE 1 %-1:100000 IJ SOLN
INTRAMUSCULAR | Status: DC | PRN
  Administered 2021-12-25: 5 mL

## 2021-12-25 MED ORDER — LIDOCAINE-EPINEPHRINE 1 %-1:100000 IJ SOLN
INTRAMUSCULAR | Status: AC
  Filled 2021-12-25: qty 1

## 2021-12-25 MED ORDER — OXYCODONE HCL 5 MG PO TABS: 5.0000 mg | ORAL_TABLET | Freq: Once | ORAL | Status: DC | PRN

## 2021-12-25 MED ORDER — HYDROMORPHONE HCL 1 MG/ML IJ SOLN: 0.2500 mg | INTRAMUSCULAR | Status: DC | PRN

## 2021-12-25 MED ORDER — METHOCARBAMOL 1000 MG/10ML IJ SOLN: 500.0000 mg | Freq: Four times a day (QID) | INTRAVENOUS | Status: DC | PRN

## 2021-12-25 MED ORDER — MONTELUKAST SODIUM 10 MG PO TABS: 10.0000 mg | ORAL_TABLET | Freq: Every day | ORAL | Status: DC

## 2021-12-25 MED ORDER — NORETHINDRONE ACETATE 5 MG PO TABS
5.0000 mg | ORAL_TABLET | Freq: Every day | ORAL | Status: DC
  Filled 2021-12-25: qty 1

## 2021-12-25 MED ORDER — FLUTICASONE PROPIONATE 50 MCG/ACT NA SUSP
1.0000 | Freq: Every day | NASAL | Status: DC
Start: 2021-12-25 — End: 2021-12-26
  Administered 2021-12-25: 1 via NASAL
  Filled 2021-12-25: qty 16

## 2021-12-25 MED ORDER — 0.9 % SODIUM CHLORIDE (POUR BTL) OPTIME
TOPICAL | Status: DC | PRN
  Administered 2021-12-25: 1000 mL

## 2021-12-25 MED ORDER — ALBUTEROL SULFATE (2.5 MG/3ML) 0.083% IN NEBU: 2.5000 mg | INHALATION_SOLUTION | Freq: Four times a day (QID) | RESPIRATORY_TRACT | Status: DC | PRN

## 2021-12-25 MED ORDER — OXYCODONE HCL 5 MG PO TABS: 5.0000 mg | ORAL_TABLET | ORAL | Status: DC | PRN

## 2021-12-25 MED ORDER — SODIUM CHLORIDE 0.9% FLUSH
3.0000 mL | Freq: Two times a day (BID) | INTRAVENOUS | Status: DC
  Administered 2021-12-25: 3 mL via INTRAVENOUS

## 2021-12-25 MED ORDER — SODIUM CHLORIDE 0.9 % IV SOLN: INTRAVENOUS | Status: DC

## 2021-12-25 MED ORDER — PHENOL 1.4 % MT LIQD
1.0000 | OROMUCOSAL | Status: DC | PRN
Start: 2021-12-25 — End: 2021-12-26

## 2021-12-25 MED ORDER — ONDANSETRON HCL 4 MG/2ML IJ SOLN
INTRAMUSCULAR | Status: DC | PRN
  Administered 2021-12-25: 4 mg via INTRAVENOUS

## 2021-12-25 MED ORDER — THROMBIN 5000 UNITS EX SOLR
OROMUCOSAL | Status: DC | PRN
  Administered 2021-12-25 (×2): 5 mL via TOPICAL

## 2021-12-25 MED ORDER — SENNA 8.6 MG PO TABS
1.0000 | ORAL_TABLET | Freq: Two times a day (BID) | ORAL | Status: DC
  Administered 2021-12-25: 8.6 mg via ORAL
  Filled 2021-12-25: qty 1

## 2021-12-25 MED ORDER — ALUM & MAG HYDROXIDE-SIMETH 200-200-20 MG/5ML PO SUSP
30.0000 mL | Freq: Four times a day (QID) | ORAL | Status: DC | PRN
Start: 2021-12-25 — End: 2021-12-26

## 2021-12-25 MED ORDER — SCOPOLAMINE 1 MG/3DAYS TD PT72
1.0000 | MEDICATED_PATCH | TRANSDERMAL | Status: DC
  Administered 2021-12-25: 1.5 mg via TRANSDERMAL
  Filled 2021-12-25: qty 1

## 2021-12-25 MED ORDER — FLEET ENEMA 7-19 GM/118ML RE ENEM: 1.0000 | ENEMA | Freq: Once | RECTAL | Status: DC | PRN

## 2021-12-25 MED ORDER — LOSARTAN POTASSIUM 50 MG PO TABS
100.0000 mg | ORAL_TABLET | Freq: Every day | ORAL | Status: DC
  Administered 2021-12-25: 100 mg via ORAL
  Filled 2021-12-25: qty 2

## 2021-12-25 MED ORDER — BUPIVACAINE HCL (PF) 0.5 % IJ SOLN
INTRAMUSCULAR | Status: AC
  Filled 2021-12-25: qty 30

## 2021-12-25 MED ORDER — CEFAZOLIN IN SODIUM CHLORIDE 3-0.9 GM/100ML-% IV SOLN
3.0000 g | INTRAVENOUS | Status: AC
  Administered 2021-12-25: 3 g via INTRAVENOUS
  Filled 2021-12-25: qty 100

## 2021-12-25 MED ORDER — ALBUTEROL SULFATE HFA 108 (90 BASE) MCG/ACT IN AERS: 2.0000 | INHALATION_SPRAY | Freq: Four times a day (QID) | RESPIRATORY_TRACT | Status: DC | PRN

## 2021-12-25 MED ORDER — BUPROPION HCL ER (XL) 300 MG PO TB24: 300.0000 mg | ORAL_TABLET | Freq: Every day | ORAL | Status: DC

## 2021-12-25 MED ORDER — FUROSEMIDE 20 MG PO TABS: 20.0000 mg | ORAL_TABLET | Freq: Two times a day (BID) | ORAL | Status: DC

## 2021-12-25 MED ORDER — METOPROLOL SUCCINATE ER 25 MG PO TB24: 25.0000 mg | ORAL_TABLET | Freq: Every day | ORAL | Status: DC

## 2021-12-25 MED ORDER — ACETAMINOPHEN 500 MG PO TABS
1000.0000 mg | ORAL_TABLET | Freq: Once | ORAL | Status: AC
  Administered 2021-12-25: 1000 mg via ORAL
  Filled 2021-12-25: qty 2

## 2021-12-25 MED ORDER — OXYCODONE-ACETAMINOPHEN 5-325 MG PO TABS: 1.0000 | ORAL_TABLET | ORAL | 0 refills | Status: AC | PRN

## 2021-12-25 MED ORDER — MENTHOL 3 MG MT LOZG: 1.0000 | LOZENGE | OROMUCOSAL | Status: DC | PRN

## 2021-12-25 MED ORDER — BISACODYL 10 MG RE SUPP: 10.0000 mg | Freq: Every day | RECTAL | Status: DC | PRN

## 2021-12-25 MED ORDER — ALBUTEROL SULFATE HFA 108 (90 BASE) MCG/ACT IN AERS
INHALATION_SPRAY | RESPIRATORY_TRACT | Status: DC | PRN
  Administered 2021-12-25: 2 via RESPIRATORY_TRACT

## 2021-12-25 MED ORDER — METHOCARBAMOL 500 MG PO TABS: 750.0000 mg | ORAL_TABLET | Freq: Four times a day (QID) | ORAL | 2 refills | Status: AC | PRN

## 2021-12-25 MED ORDER — METHOCARBAMOL 500 MG PO TABS
500.0000 mg | ORAL_TABLET | Freq: Four times a day (QID) | ORAL | Status: DC | PRN
  Administered 2021-12-25: 500 mg via ORAL
  Filled 2021-12-25: qty 1

## 2021-12-25 MED ORDER — MOMETASONE FURO-FORMOTEROL FUM 100-5 MCG/ACT IN AERO: 2.0000 | INHALATION_SPRAY | Freq: Two times a day (BID) | RESPIRATORY_TRACT | Status: DC

## 2021-12-25 MED ORDER — CHLORHEXIDINE GLUCONATE 0.12 % MT SOLN
15.0000 mL | Freq: Once | OROMUCOSAL | Status: AC
  Administered 2021-12-25: 15 mL via OROMUCOSAL
  Filled 2021-12-25: qty 15

## 2021-12-25 MED ORDER — BUPIVACAINE HCL 0.5 % IJ SOLN
INTRAMUSCULAR | Status: DC | PRN
  Administered 2021-12-25: 5 mL

## 2021-12-25 MED ORDER — MIDAZOLAM HCL 2 MG/2ML IJ SOLN
INTRAMUSCULAR | Status: DC | PRN
  Administered 2021-12-25 (×2): 1 mg via INTRAVENOUS

## 2021-12-25 MED ORDER — OXYCODONE HCL 5 MG PO TABS
10.0000 mg | ORAL_TABLET | ORAL | Status: DC | PRN
  Administered 2021-12-25 (×2): 10 mg via ORAL
  Filled 2021-12-25 (×2): qty 2

## 2021-12-25 MED ORDER — POLYETHYLENE GLYCOL 3350 17 G PO PACK: 17.0000 g | PACK | Freq: Every day | ORAL | Status: DC | PRN

## 2021-12-25 MED ORDER — THROMBIN 5000 UNITS EX SOLR
CUTANEOUS | Status: AC
Start: 2021-12-25 — End: ?
  Filled 2021-12-25: qty 5000

## 2021-12-25 MED ORDER — ROCURONIUM BROMIDE 10 MG/ML (PF) SYRINGE
PREFILLED_SYRINGE | INTRAVENOUS | Status: DC | PRN
  Administered 2021-12-25: 60 mg via INTRAVENOUS
  Administered 2021-12-25: 40 mg via INTRAVENOUS
  Administered 2021-12-25: 20 mg via INTRAVENOUS

## 2021-12-25 MED ORDER — FENTANYL CITRATE (PF) 250 MCG/5ML IJ SOLN
INTRAMUSCULAR | Status: DC | PRN
  Administered 2021-12-25 (×5): 50 ug via INTRAVENOUS

## 2021-12-25 MED ORDER — CHLORHEXIDINE GLUCONATE CLOTH 2 % EX PADS: 6.0000 | MEDICATED_PAD | Freq: Once | CUTANEOUS | Status: DC

## 2021-12-25 MED ORDER — FLUOXETINE HCL 20 MG PO CAPS
20.0000 mg | ORAL_CAPSULE | Freq: Every day | ORAL | Status: DC
  Filled 2021-12-25: qty 1

## 2021-12-25 MED ORDER — TOPIRAMATE 100 MG PO TABS
100.0000 mg | ORAL_TABLET | Freq: Two times a day (BID) | ORAL | Status: DC
Start: 2021-12-25 — End: 2021-12-26
  Filled 2021-12-25: qty 1

## 2021-12-25 MED ORDER — TRAMADOL HCL 50 MG PO TABS: 50.0000 mg | ORAL_TABLET | Freq: Four times a day (QID) | ORAL | Status: DC | PRN

## 2021-12-25 MED ORDER — FENTANYL CITRATE (PF) 250 MCG/5ML IJ SOLN
INTRAMUSCULAR | Status: AC
  Filled 2021-12-25: qty 5

## 2021-12-25 MED ORDER — TIZANIDINE HCL 4 MG PO TABS
4.0000 mg | ORAL_TABLET | Freq: Three times a day (TID) | ORAL | Status: DC
Start: 2021-12-25 — End: 2021-12-26
  Administered 2021-12-25: 4 mg via ORAL
  Filled 2021-12-25: qty 1

## 2021-12-25 MED ORDER — PREDNISONE 50 MG PO TABS
60.0000 mg | ORAL_TABLET | Freq: Every day | ORAL | Status: DC
  Administered 2021-12-25: 60 mg via ORAL
  Filled 2021-12-25: qty 1

## 2021-12-25 MED ORDER — SUMATRIPTAN SUCCINATE 100 MG PO TABS: 100.0000 mg | ORAL_TABLET | ORAL | Status: DC | PRN

## 2021-12-25 MED ORDER — LIDOCAINE 2% (20 MG/ML) 5 ML SYRINGE
INTRAMUSCULAR | Status: DC | PRN
  Administered 2021-12-25: 40 mg via INTRAVENOUS

## 2021-12-25 MED ORDER — OXYCODONE HCL 5 MG/5ML PO SOLN: 5.0000 mg | Freq: Once | ORAL | Status: DC | PRN

## 2021-12-25 MED ORDER — OXYMETAZOLINE HCL 0.05 % NA SOLN
NASAL | Status: AC
  Filled 2021-12-25: qty 30

## 2021-12-25 MED ORDER — LACTATED RINGERS IV SOLN: INTRAVENOUS | Status: DC

## 2021-12-25 MED ORDER — ONDANSETRON HCL 4 MG PO TABS: 4.0000 mg | ORAL_TABLET | Freq: Four times a day (QID) | ORAL | Status: DC | PRN

## 2021-12-25 MED ORDER — SODIUM CHLORIDE 0.9% FLUSH: 3.0000 mL | INTRAVENOUS | Status: DC | PRN

## 2021-12-25 MED ORDER — PANTOPRAZOLE SODIUM 40 MG IV SOLR: 40.0000 mg | Freq: Every day | INTRAVENOUS | Status: DC

## 2021-12-25 MED ORDER — PANTOPRAZOLE SODIUM 40 MG PO TBEC
40.0000 mg | DELAYED_RELEASE_TABLET | Freq: Every day | ORAL | Status: DC
  Administered 2021-12-25: 40 mg via ORAL
  Filled 2021-12-25: qty 1

## 2021-12-25 SURGICAL SUPPLY — 68 items
BAG COUNTER SPONGE SURGICOUNT (BAG) ×2 IMPLANT
BAND RUBBER #18 3X1/16 STRL (MISCELLANEOUS) ×4 IMPLANT
BENZOIN TINCTURE PRP APPL 2/3 (GAUZE/BANDAGES/DRESSINGS) IMPLANT
BIT DRILL 13 (BIT) ×1 IMPLANT
BLADE CLIPPER SURG (BLADE) IMPLANT
BLADE SURG 11 STRL SS (BLADE) ×2 IMPLANT
BLADE ULTRA TIP 2M (BLADE) IMPLANT
BNDG GAUZE ELAST 4 BULKY (GAUZE/BANDAGES/DRESSINGS) IMPLANT
BUR MATCHSTICK NEURO 3.0 LAGG (BURR) ×2 IMPLANT
CANISTER SUCT 3000ML PPV (MISCELLANEOUS) ×2 IMPLANT
CARTRIDGE OIL MAESTRO DRILL (MISCELLANEOUS) ×1 IMPLANT
DERMABOND ADVANCED (GAUZE/BANDAGES/DRESSINGS) ×1
DERMABOND ADVANCED .7 DNX12 (GAUZE/BANDAGES/DRESSINGS) ×1 IMPLANT
DEVICE ENDSKLTN IMPLNT MED 5MM (Endomechanicals) IMPLANT
DEVICE ENDSKLTN TC NANOLCK 6MM (Cage) IMPLANT
DIFFUSER DRILL AIR PNEUMATIC (MISCELLANEOUS) ×2 IMPLANT
DRAIN CHANNEL 10M FLAT 3/4 FLT (DRAIN) IMPLANT
DRAPE C-ARM 42X72 X-RAY (DRAPES) ×4 IMPLANT
DRAPE HALF SHEET 40X57 (DRAPES) ×1 IMPLANT
DRAPE LAPAROTOMY 100X72 PEDS (DRAPES) ×2 IMPLANT
DRAPE MICROSCOPE LEICA (MISCELLANEOUS) ×2 IMPLANT
DRSG OPSITE 4X5.5 SM (GAUZE/BANDAGES/DRESSINGS) ×4 IMPLANT
DRSG OPSITE POSTOP 3X4 (GAUZE/BANDAGES/DRESSINGS) ×4 IMPLANT
DRSG OPSITE POSTOP 4X6 (GAUZE/BANDAGES/DRESSINGS) ×1 IMPLANT
DURAPREP 6ML APPLICATOR 50/CS (WOUND CARE) ×2 IMPLANT
ELECT COATED BLADE 2.86 ST (ELECTRODE) ×2 IMPLANT
ELECT REM PT RETURN 9FT ADLT (ELECTROSURGICAL) ×2
ELECTRODE REM PT RTRN 9FT ADLT (ELECTROSURGICAL) ×1 IMPLANT
ENDOSKELETON IMPLANT MED 5MM (Endomechanicals) ×2 IMPLANT
ENDOSKELETON TC NANOLOCK 6MM (Cage) ×2 IMPLANT
EVACUATOR SILICONE 100CC (DRAIN) IMPLANT
GAUZE 4X4 16PLY ~~LOC~~+RFID DBL (SPONGE) IMPLANT
GLOVE BIO SURGEON STRL SZ7.5 (GLOVE) ×3 IMPLANT
GLOVE BIOGEL PI IND STRL 7.0 (GLOVE) IMPLANT
GLOVE BIOGEL PI IND STRL 7.5 (GLOVE) ×2 IMPLANT
GLOVE BIOGEL PI INDICATOR 7.0 (GLOVE) ×3
GLOVE BIOGEL PI INDICATOR 7.5 (GLOVE) ×2
GLOVE ECLIPSE 7.0 STRL STRAW (GLOVE) ×3 IMPLANT
GLOVE EXAM NITRILE XL STR (GLOVE) IMPLANT
GOWN STRL REUS W/ TWL LRG LVL3 (GOWN DISPOSABLE) ×2 IMPLANT
GOWN STRL REUS W/ TWL XL LVL3 (GOWN DISPOSABLE) IMPLANT
GOWN STRL REUS W/TWL 2XL LVL3 (GOWN DISPOSABLE) IMPLANT
GOWN STRL REUS W/TWL LRG LVL3 (GOWN DISPOSABLE) ×4
GOWN STRL REUS W/TWL XL LVL3 (GOWN DISPOSABLE) ×1
HEMOSTAT POWDER KIT SURGIFOAM (HEMOSTASIS) ×3 IMPLANT
KIT BASIN OR (CUSTOM PROCEDURE TRAY) ×2 IMPLANT
KIT TURNOVER KIT B (KITS) ×2 IMPLANT
NDL SPNL 22GX3.5 QUINCKE BK (NEEDLE) ×1 IMPLANT
NEEDLE HYPO 22GX1.5 SAFETY (NEEDLE) ×2 IMPLANT
NEEDLE SPNL 22GX3.5 QUINCKE BK (NEEDLE) ×2 IMPLANT
NS IRRIG 1000ML POUR BTL (IV SOLUTION) ×2 IMPLANT
OIL CARTRIDGE MAESTRO DRILL (MISCELLANEOUS) ×2
PACK LAMINECTOMY NEURO (CUSTOM PROCEDURE TRAY) ×2 IMPLANT
PAD ARMBOARD 7.5X6 YLW CONV (MISCELLANEOUS) ×6 IMPLANT
PLATE ZEVO 1LVL 17MM (Plate) ×2 IMPLANT
PUTTY DBF 3CC CORTICAL FIBERS (Putty) ×1 IMPLANT
SCREW 3.5 SELFDRILL 15MM VARI (Screw) ×8 IMPLANT
SPIKE FLUID TRANSFER (MISCELLANEOUS) ×2 IMPLANT
SPONGE INTESTINAL PEANUT (DISPOSABLE) ×2 IMPLANT
SPONGE SURGIFOAM ABS GEL 100 (HEMOSTASIS) ×1 IMPLANT
STRIP CLOSURE SKIN 1/2X4 (GAUZE/BANDAGES/DRESSINGS) IMPLANT
SUT ETHILON 3 0 FSL (SUTURE) IMPLANT
SUT VIC AB 3-0 SH 8-18 (SUTURE) ×2 IMPLANT
SUT VICRYL 3-0 RB1 18 ABS (SUTURE) ×2 IMPLANT
TAPE CLOTH 3X10 TAN LF (GAUZE/BANDAGES/DRESSINGS) ×2 IMPLANT
TOWEL GREEN STERILE (TOWEL DISPOSABLE) ×2 IMPLANT
TOWEL GREEN STERILE FF (TOWEL DISPOSABLE) ×2 IMPLANT
WATER STERILE IRR 1000ML POUR (IV SOLUTION) ×2 IMPLANT

## 2021-12-25 NOTE — Discharge Summary (Signed)
Physician Discharge Summary  Patient ID: Barbara Richardson MRN: [id number] DOB/AGE: May 09, 1971 51 y.o.  Admit date: 12/25/2021 Discharge date: 12/25/2021  Admission Diagnoses: Cervical Spondylosis with radiculopathy, C5-6, C6-7  Discharge Diagnoses: Cervical Spondylosis with radiculopathy, C5-6, C6-7 Principal Problem:   Cervical spondylosis with radiculopathy   Discharged Condition: good  Hospital Course: The patient was admitted on 12/25/2021 and taken to the operating room where the patient underwent ACDF C5-6 C6-7. The patient tolerated the procedure well and was taken to the recovery room and then to the floor in stable condition. The hospital course was routine. There were no complications. The wound remained clean dry and intact. Pt had appropriate upper back/neck soreness. No complaints of arm pain or new N/T/W. The patient remained afebrile with stable vital signs, and tolerated a regular diet. The patient continued to increase activities, and pain was well controlled with oral pain medications.   Consults: None  Significant Diagnostic Studies: radiology: X-Ray: intraoperative   Treatments: surgery:  1. Discectomy at C5-6, C6-7 for decompression of spinal cord and exiting nerve roots  2. Placement of intervertebral biomechanical device, Medtronic Titan 65m lordotic cage @ C5-6, 567mlordotic cage @ C6-7 3. Placement of anterior instrumentation consisting of interbody plate and screws C5U1-3nd C6-7 - Medtronic Zevo 1752mlate x 2, 93m14HOrews x8 4. Use of morselized bone allograft  5. Arthrodesis C5-6, C6-7, anterior interbody technique  6. Use of intraoperative microscope    Discharge Exam: Blood pressure (!) 144/86, pulse 74, temperature 97.9 F (36.6 C), temperature source Oral, resp. rate 18, height '5\' 9"'$  (1.753 m), weight (!) 165.1 kg, last menstrual period 12/04/2021, SpO2 98 %. Physical Exam: Patient is awake, A/O X 4, conversant, and in good spirits. Eyes open  spontaneously. They are in NAD and VSS. Doing well. Speech is fluent and appropriate. MAEW with good strength that is symmetric bilaterally. Sensation to light touch is intact. PERLA, EOMI. CNs grossly intact. Dressing is clean dry intact. Incision is well approximated with no drainage, erythema, or edema. Soft cervical collar in place     Disposition: Discharge disposition: 01-Home or Self Care       Discharge Instructions     Incentive spirometry RT   Complete by: As directed       Allergies as of 12/25/2021       Reactions   Other Hives   Peaches        Medication List     STOP taking these medications    naproxen 500 MG tablet Commonly known as: NAPROSYN   tiZANidine 4 MG tablet Commonly known as: ZANAFLEX   traMADol 50 MG tablet Commonly known as: ULTRAM       TAKE these medications    albuterol (2.5 MG/3ML) 0.083% nebulizer solution Commonly known as: PROVENTIL Take 2.5 mg by nebulization every 6 (six) hours as needed for wheezing or shortness of breath.   albuterol 108 (90 Base) MCG/ACT inhaler Commonly known as: VENTOLIN HFA Inhale 2 puffs into the lungs every 6 (six) hours as needed for wheezing.   amLODipine 5 MG tablet Commonly known as: NORVASC Take 5 mg by mouth daily.   Breo Ellipta 200-25 MCG/ACT Aepb Generic drug: fluticasone furoate-vilanterol Inhale 1 puff into the lungs daily.   budesonide-formoterol 80-4.5 MCG/ACT inhaler Commonly known as: Symbicort Inhale 2 puffs into the lungs 2 (two) times daily.   buPROPion 300 MG 24 hr tablet Commonly known as: WELLBUTRIN XL Take 300 mg by mouth daily.   carboxymethylcellulose 0.5 %  Soln Commonly known as: REFRESH PLUS Place 1 drop into both eyes 3 (three) times daily as needed (dry eyes).   COLLAGEN PO Take 1 Scoop by mouth daily. Collagen Peptides   diclofenac Sodium 1 % Gel Commonly known as: VOLTAREN Apply 2 g topically daily as needed (pain).   ELDERBERRY PO Take 1  capsule by mouth daily.   FLUoxetine 20 MG tablet Commonly known as: PROZAC Take 20 mg by mouth daily.   fluticasone 50 MCG/ACT nasal spray Commonly known as: FLONASE Place 1 spray into both nostrils daily. What changed:  when to take this reasons to take this   furosemide 20 MG tablet Commonly known as: LASIX Take 20 mg by mouth 2 (two) times daily.   gabapentin 600 MG tablet Commonly known as: NEURONTIN Take 600 mg by mouth 2 (two) times daily.   ICY HOT EX Apply 1 Application topically daily as needed (pain).   losartan 100 MG tablet Commonly known as: COZAAR Take 100 mg by mouth daily.   methocarbamol 500 MG tablet Commonly known as: ROBAXIN Take 1 tablet (500 mg total) by mouth every 6 (six) hours as needed for muscle spasms. What changed: Another medication with the same name was added. Make sure you understand how and when to take each.   methocarbamol 500 MG tablet Commonly known as: ROBAXIN Take 1.5 tablets (750 mg total) by mouth every 6 (six) hours as needed for muscle spasms. What changed: You were already taking a medication with the same name, and this prescription was added. Make sure you understand how and when to take each.   metoprolol succinate 25 MG 24 hr tablet Commonly known as: TOPROL-XL Take 25 mg by mouth daily.   montelukast 10 MG tablet Commonly known as: SINGULAIR Take 10 mg by mouth at bedtime.   norethindrone 5 MG tablet Commonly known as: AYGESTIN Take 5 mg by mouth daily.   omeprazole 40 MG capsule Commonly known as: PRILOSEC Take 40 mg by mouth daily.   ondansetron 4 MG tablet Commonly known as: ZOFRAN Take 4 mg by mouth every 8 (eight) hours as needed for nausea or vomiting.   oxyCODONE-acetaminophen 5-325 MG tablet Commonly known as: Percocet Take 1-2 tablets by mouth every 4 (four) hours as needed for severe pain.   predniSONE 20 MG tablet Commonly known as: DELTASONE Take 3 tablets (60 mg total) by mouth daily.    SUMAtriptan 100 MG tablet Commonly known as: IMITREX Take 100 mg by mouth every 2 (two) hours as needed for migraine.   topiramate 100 MG tablet Commonly known as: TOPAMAX Take 100 mg by mouth 2 (two) times daily.         Signed: Marvis Moeller, DNP, AGNP-C Neurosurgery Nurse Practitioner  [facility]: [facility] [place] [address], [place], [place] [address] P: [phone]    F: [phone]  12/25/2021 6:06 PM

## 2021-12-25 NOTE — Op Note (Signed)
NEUROSURGERY OPERATIVE NOTE   PREOP DIAGNOSIS: Cervical Spondylosis with radiculopathy, C5-6, C6-7  POSTOP DIAGNOSIS: Same  PROCEDURE: 1. Discectomy at C5-6, C6-7 for decompression of spinal cord and exiting nerve roots  2. Placement of intervertebral biomechanical device, Medtronic Titan 73m lordotic cage @ C5-6, 526mlordotic cage @ C6-7 3. Placement of anterior instrumentation consisting of interbody plate and screws C5W2-5nd C6-7 - Medtronic Zevo 1752mlate x 2, 19m85IDrews x8 4. Use of morselized bone allograft  5. Arthrodesis C5-6, C6-7, anterior interbody technique  6. Use of intraoperative microscope  SURGEON: Dr. NeeConsuella LoseD  ASSISTANT: Dr. TroElwin SleightD  ANESTHESIA: General Endotracheal  EBL: 75cc  SPECIMENS: None  DRAINS: None  COMPLICATIONS: None immediate  CONDITION: Hemodynamically stable to PACU  HISTORY: Aislynn MabMathisen a 51 42o. female with a previous history of C3-4 C4-5 ACDF several years ago.  Patient presented back to the office with several months of recurrent primarily right-sided neck and arm pain.  She has undergone multiple different conservative treatments including a series of epidural steroid injections which provided good but transient improvement.  She therefore elected to proceed with repeat ACDF.  The risks, benefits, and alternatives to surgery were all reviewed in detail with the patient.  After all her questions were answered, informed consent was obtained and witnessed.  PROCEDURE IN DETAIL: The patient was brought to the operating room and transferred to the operative table. After induction of general anesthesia, the patient was positioned on the operative table in the supine position with all pressure points meticulously padded. The skin of the neck was then prepped and draped in the usual sterile fashion.  Previous transverse right-sided incision was identified and a new incision slightly inferior to this, just above the  clavicle was marked out.  After timeout was conducted, the skin was infiltrated with local anesthetic. Skin incision was then made sharply and Bovie electrocautery was used to dissect the subcutaneous tissue until the platysma was identified. The platysma was then divided and undermined. The sternocleidomastoid muscle was then identified and, utilizing natural fascial planes in the neck, the prevertebral fascia was identified and the carotid sheath was retracted laterally and the trachea and esophagus retracted medially. Again using fluoroscopy, the correct disc spaces were identified. Bovie electrocautery was used to dissect in the subperiosteal plane and elevate the bilateral longus coli muscles. Table mounted retractors were then placed. At this point, the microscope was draped and brought into the field, and the remainder of the case was done under the microscope using microdissecting technique.  The C5-6 disc space was incised sharply and rongeurs were use to initially complete a discectomy. The high-speed drill was then used to complete discectomy until the posterior annulus was identified and removed and the posterior longitudinal ligament was identified. Using a nerve hook, the PLL was elevated, and Kerrison rongeurs were used to remove the posterior longitudinal ligament and the ventral thecal sac was identified. Using a combination of curettes and rongeurs, complete decompression of the thecal sac and exiting nerve roots at this level was completed, and verified using micro-nerve hook.  At this point, a Lordotic interbody cage was sized and packed with morcellized bone allograft. This was then inserted and tapped into place  flush with the anterior vertebral body.  A 17 millimeter plate was then placed across C5-6 and secured with 15 millimeter screws.  Attention was then turned to the   C6-7 level. In a similar fashion, discectomy was completed initially with curettes  and rongeurs, and completed  with the drill. The PLL was again identified, elevated and incised. Using Kerrison rongeurs, decompression of the spinal cord and exiting roots  was completed including removal of the posterior aspect of the uncovertebral joints in order to fully decompress the C7 nerve roots in the foramina.  A 5 millimeter lordotic interbody cage was then sized and filled with bone allograft, and tapped into place.  Another 17 millimeter plate was placed across the interspace and secured with 15 millimeter screws. Final fluoroscopic images in lateral projection were taken to confirm good hardware placement.  At this point, after all counts were verified to be correct, meticulous hemostasis was secured using a combination of bipolar electrocautery and passive hemostatics. The platysma muscle was then closed using interrupted 3-0 Vicryl sutures, and the skin was closed with a interrupted subcuticular stitch. Sterile dressings were then applied and the drapes removed.  The patient tolerated the procedure well and was extubated in the room and taken to the postanesthesia care unit in stable condition.   Consuella Lose, MD [facility]

## 2021-12-26 MED FILL — Thrombin For Soln 5000 Unit: CUTANEOUS | Qty: 5000 | Status: AC

## 2021-12-27 ENCOUNTER — Encounter (HOSPITAL_COMMUNITY): Payer: Self-pay | Admitting: Neurosurgery

## 2022-01-16 DIAGNOSIS — M4722 Other spondylosis with radiculopathy, cervical region: Secondary | ICD-10-CM | POA: Diagnosis not present

## 2022-01-30 DIAGNOSIS — K219 Gastro-esophageal reflux disease without esophagitis: Secondary | ICD-10-CM | POA: Diagnosis not present

## 2022-01-30 DIAGNOSIS — J4551 Severe persistent asthma with (acute) exacerbation: Secondary | ICD-10-CM | POA: Diagnosis not present

## 2022-01-30 DIAGNOSIS — M545 Low back pain, unspecified: Secondary | ICD-10-CM | POA: Diagnosis not present

## 2022-01-30 DIAGNOSIS — G43909 Migraine, unspecified, not intractable, without status migrainosus: Secondary | ICD-10-CM | POA: Diagnosis not present

## 2022-01-30 DIAGNOSIS — Z9189 Other specified personal risk factors, not elsewhere classified: Secondary | ICD-10-CM | POA: Diagnosis not present

## 2022-01-30 DIAGNOSIS — R079 Chest pain, unspecified: Secondary | ICD-10-CM | POA: Diagnosis not present

## 2022-01-30 DIAGNOSIS — G4733 Obstructive sleep apnea (adult) (pediatric): Secondary | ICD-10-CM | POA: Diagnosis not present

## 2022-01-30 DIAGNOSIS — R0602 Shortness of breath: Secondary | ICD-10-CM | POA: Diagnosis not present

## 2022-01-30 DIAGNOSIS — Z9889 Other specified postprocedural states: Secondary | ICD-10-CM | POA: Diagnosis not present

## 2022-01-30 DIAGNOSIS — Z9981 Dependence on supplemental oxygen: Secondary | ICD-10-CM | POA: Diagnosis not present

## 2022-01-30 DIAGNOSIS — E785 Hyperlipidemia, unspecified: Secondary | ICD-10-CM | POA: Diagnosis not present

## 2022-01-30 DIAGNOSIS — G894 Chronic pain syndrome: Secondary | ICD-10-CM | POA: Diagnosis not present

## 2022-01-30 DIAGNOSIS — Z79899 Other long term (current) drug therapy: Secondary | ICD-10-CM | POA: Diagnosis not present

## 2022-01-30 DIAGNOSIS — M1990 Unspecified osteoarthritis, unspecified site: Secondary | ICD-10-CM | POA: Diagnosis not present

## 2022-01-30 DIAGNOSIS — J45901 Unspecified asthma with (acute) exacerbation: Secondary | ICD-10-CM | POA: Diagnosis not present

## 2022-01-30 DIAGNOSIS — R030 Elevated blood-pressure reading, without diagnosis of hypertension: Secondary | ICD-10-CM | POA: Diagnosis not present

## 2022-01-30 DIAGNOSIS — Z91199 Patient's noncompliance with other medical treatment and regimen due to unspecified reason: Secondary | ICD-10-CM | POA: Diagnosis not present

## 2022-01-30 DIAGNOSIS — R0600 Dyspnea, unspecified: Secondary | ICD-10-CM | POA: Diagnosis not present

## 2022-01-30 DIAGNOSIS — I4891 Unspecified atrial fibrillation: Secondary | ICD-10-CM | POA: Diagnosis not present

## 2022-01-30 DIAGNOSIS — Z7951 Long term (current) use of inhaled steroids: Secondary | ICD-10-CM | POA: Diagnosis not present

## 2022-01-30 DIAGNOSIS — E7800 Pure hypercholesterolemia, unspecified: Secondary | ICD-10-CM | POA: Diagnosis not present

## 2022-01-30 DIAGNOSIS — Z8679 Personal history of other diseases of the circulatory system: Secondary | ICD-10-CM | POA: Diagnosis not present

## 2022-01-30 DIAGNOSIS — I10 Essential (primary) hypertension: Secondary | ICD-10-CM | POA: Diagnosis not present

## 2022-02-05 DIAGNOSIS — I10 Essential (primary) hypertension: Secondary | ICD-10-CM | POA: Diagnosis not present

## 2022-02-05 DIAGNOSIS — J4540 Moderate persistent asthma, uncomplicated: Secondary | ICD-10-CM | POA: Diagnosis not present

## 2022-02-14 DIAGNOSIS — J45901 Unspecified asthma with (acute) exacerbation: Secondary | ICD-10-CM | POA: Diagnosis not present

## 2022-02-14 DIAGNOSIS — Z8673 Personal history of transient ischemic attack (TIA), and cerebral infarction without residual deficits: Secondary | ICD-10-CM | POA: Diagnosis not present

## 2022-03-18 DIAGNOSIS — Z1231 Encounter for screening mammogram for malignant neoplasm of breast: Secondary | ICD-10-CM | POA: Diagnosis not present

## 2022-03-29 NOTE — Progress Notes (Deleted)
Office Visit Note  Patient: Barbara Richardson             Date of Birth: May 06, 1971           MRN: [id number]             PCP: Lujean Amel, MD Referring: Lujean Amel, MD Visit Date: 04/11/2022 Occupation: _0 @  Subjective:  No chief complaint on file.   History of Present Illness: Barbara Richardson is a 51 y.o. female ***   Activities of Daily Living:  Patient reports morning stiffness for *** {minute/hour:19697}.   Patient {ACTIONS;DENIES/REPORTS:21021675::"Denies"} nocturnal pain.  Difficulty dressing/grooming: {ACTIONS;DENIES/REPORTS:21021675::"Denies"} Difficulty climbing stairs: {ACTIONS;DENIES/REPORTS:21021675::"Denies"} Difficulty getting out of chair: {ACTIONS;DENIES/REPORTS:21021675::"Denies"} Difficulty using hands for taps, buttons, cutlery, and/or writing: {ACTIONS;DENIES/REPORTS:21021675::"Denies"}  No Rheumatology ROS completed.   PMFS History:  Patient Active Problem List   Diagnosis Date Noted   Cervical spondylosis with radiculopathy 12/25/2021   Cervical radiculopathy 01/23/2018   Morbid obesity due to excess calories ([place]) 05/31/2017   OSA (obstructive sleep apnea) 08/31/2014   Inadequate sleep hygiene 08/31/2014   Excessive daytime sleepiness 05/23/2014   Dyspnea on exertion 05/06/2014   Essential hypertension 10/18/2011   Cough variant asthma 10/18/2011   GERD (gastroesophageal reflux disease) 10/18/2011    Past Medical History:  Diagnosis Date   Abnormal Pap smear    Anemia    Anxiety    Asthma    Atrial fibrillation ([place])    Bipolar disorder (HCC)    BV (bacterial vaginosis) 08/24/2010   Cervical radiculopathy 01/23/2018   Cough variant asthma 10/18/2011   05/29/2017  After extensive coaching HFA effectiveness =    90% > continue symb 160 2bid - Allergy profile 05/29/17  >  Eos 0.6 /  IgE  172  RAST pos mild mold only  - FENO 05/29/2017  =   36 -   08/11/2017  After extensive coaching inhaler device  effectiveness =    75% >  continue symicort 160  2bid/ singulair  - PFT's  08/11/2017  FEV1 2.04 (77 % ) ratio 73  p 7 % improvement from saba p symb/saba   DDD (degenerative disc disease), cervical    Depression    DJD (degenerative joint disease)    DUB (dysfunctional uterine bleeding) 02/08/2010   Dyspnea    Dyspnea on exertion 05/06/2014   Followed in Pulmonary clinic/ [facility]/ [place] - trial off all acei 05/06/2014  > improved 07/20/14 with nl pfts x low erv > try off symbicort  - 05/29/2017  Walked RA x 3 laps @ 185 ft each stopped due to  End of study, fast pace, leg pain and sob and end with sats down to 90%      Endometrial polyp 03/2010   GERD (gastroesophageal reflux disease)    Hx of chlamydia infection 1995   Hx of constipation 10/17/2010   Hx of menorrhagia 03/2010   Hypertension    Inadequate sleep hygiene 08/31/2014   Migraines    Morbid obesity due to excess calories ([place]) 05/31/2017   Obesity    Obesity, morbid (more than 100 lbs over ideal weight or BMI > 40) ([place]) 2011   OSA (obstructive sleep apnea) 08/31/2014   Uterine fibroid 03/2010   Yeast infection     Family History  Problem Relation Age of Onset   Diabetes Father    Cancer Maternal Grandfather    Heart disease Maternal Grandmother    Past Surgical History:  Procedure Laterality Date   ANTERIOR CERVICAL DECOMP/DISCECTOMY FUSION  N/A 01/23/2018   Procedure: ANTERIOR CERVICAL DECOMPRESSION/DISCECTOMY FUSION CERVICAL THREE- CERVICAL FOUR, CERVICAL FOUR- CERVICAL FIVE;  Surgeon: Consuella Lose, MD;  Location: [facility];  Service: Neurosurgery;  Laterality: N/A;   ANTERIOR CERVICAL DECOMP/DISCECTOMY FUSION N/A 12/25/2021   Procedure: Anterior Cervical Decompression Fusion Cervical five-six, Cervical six-seven;  Surgeon: Consuella Lose, MD;  Location: [facility];  Service: Neurosurgery;  Laterality: N/A;   BACK SURGERY     BREAST REDUCTION SURGERY  1999   CARPAL TUNNEL RELEASE     CESAREAN SECTION     HYSTEROSCOPY W/ ENDOMETRIAL  ABLATION  2011   KNEE SURGERY Left    fracture   TONSILLECTOMY  2010   TUBAL LIGATION  1999   Social History   Social History Narrative   Not on file   Immunization History  Administered Date(s) Administered   Influenza Split 04/25/2009   Influenza Whole 05/01/2017   Influenza,inj,quad, With Preservative 03/06/2016   PPD Test 03/06/2016     Objective: Vital Signs: There were no vitals taken for this visit.   Physical Exam   Musculoskeletal Exam: ***  CDAI Exam: CDAI Score: -- Patient Global: --; Provider Global: -- Swollen: --; Tender: -- Joint Exam 04/11/2022   No joint exam has been documented for this visit   There is currently no information documented on the homunculus. Go to the Rheumatology activity and complete the homunculus joint exam.  Investigation: No additional findings.  Imaging: No results found.  Recent Labs: Lab Results  Component Value Date   WBC 7.4 12/19/2021   HGB 14.5 12/19/2021   PLT 301 12/19/2021   NA 136 12/19/2021   K 4.3 12/19/2021   CL 109 12/19/2021   CO2 21 (L) 12/19/2021   GLUCOSE 82 12/19/2021   BUN 15 12/19/2021   CREATININE 1.04 (H) 12/19/2021   BILITOT 0.6 04/23/2010   ALKPHOS 46 04/23/2010   AST 15 04/23/2010   ALT 11 04/23/2010   PROT 8.2 04/23/2010   ALBUMIN 3.9 04/23/2010   CALCIUM 9.3 12/19/2021   GFRAA >60 08/18/2018    Speciality Comments: No specialty comments available.  Procedures:  No procedures performed Allergies: Other   Assessment / Plan:     Visit Diagnoses: Polyarthralgia  Elevated sed rate - 09/19/21:ESR 53, RF<10, Anti-CCP 7, Vitamin B12 522, TSH 3.05  Essential hypertension  OSA (obstructive sleep apnea)  Cough variant asthma  History of gastroesophageal reflux (GERD)  Cervical spondylosis with radiculopathy  Inadequate sleep hygiene  Excessive daytime sleepiness  Orders: No orders of the defined types were placed in this encounter.  No orders of the defined types were  placed in this encounter.   Face-to-face time spent with patient was *** minutes. Greater than 50% of time was spent in counseling and coordination of care.  Follow-Up Instructions: No follow-ups on file.   Ofilia Neas, PA-C  Note - This record has been created using Dragon software.  Chart creation errors have been sought, but may not always  have been located. Such creation errors do not reflect on  the standard of medical care.

## 2022-04-01 DIAGNOSIS — Z8639 Personal history of other endocrine, nutritional and metabolic disease: Secondary | ICD-10-CM | POA: Diagnosis not present

## 2022-04-01 DIAGNOSIS — R5383 Other fatigue: Secondary | ICD-10-CM | POA: Diagnosis not present

## 2022-04-01 DIAGNOSIS — Z79899 Other long term (current) drug therapy: Secondary | ICD-10-CM | POA: Diagnosis not present

## 2022-04-01 DIAGNOSIS — E7800 Pure hypercholesterolemia, unspecified: Secondary | ICD-10-CM | POA: Diagnosis not present

## 2022-04-01 DIAGNOSIS — R0602 Shortness of breath: Secondary | ICD-10-CM | POA: Diagnosis not present

## 2022-04-01 DIAGNOSIS — I10 Essential (primary) hypertension: Secondary | ICD-10-CM | POA: Diagnosis not present

## 2022-04-01 DIAGNOSIS — E162 Hypoglycemia, unspecified: Secondary | ICD-10-CM | POA: Diagnosis not present

## 2022-04-01 DIAGNOSIS — J4541 Moderate persistent asthma with (acute) exacerbation: Secondary | ICD-10-CM | POA: Diagnosis not present

## 2022-04-01 DIAGNOSIS — E65 Localized adiposity: Secondary | ICD-10-CM | POA: Diagnosis not present

## 2022-04-01 DIAGNOSIS — E8889 Other specified metabolic disorders: Secondary | ICD-10-CM | POA: Diagnosis not present

## 2022-04-01 DIAGNOSIS — G4733 Obstructive sleep apnea (adult) (pediatric): Secondary | ICD-10-CM | POA: Diagnosis not present

## 2022-04-11 ENCOUNTER — Ambulatory Visit: Payer: Medicare Other | Admitting: Rheumatology

## 2022-04-11 DIAGNOSIS — G4719 Other hypersomnia: Secondary | ICD-10-CM

## 2022-04-11 DIAGNOSIS — R700 Elevated erythrocyte sedimentation rate: Secondary | ICD-10-CM

## 2022-04-11 DIAGNOSIS — Z8719 Personal history of other diseases of the digestive system: Secondary | ICD-10-CM

## 2022-04-11 DIAGNOSIS — I10 Essential (primary) hypertension: Secondary | ICD-10-CM

## 2022-04-11 DIAGNOSIS — G4733 Obstructive sleep apnea (adult) (pediatric): Secondary | ICD-10-CM

## 2022-04-11 DIAGNOSIS — J45991 Cough variant asthma: Secondary | ICD-10-CM

## 2022-04-11 DIAGNOSIS — Z72821 Inadequate sleep hygiene: Secondary | ICD-10-CM

## 2022-04-11 DIAGNOSIS — M4722 Other spondylosis with radiculopathy, cervical region: Secondary | ICD-10-CM

## 2022-04-11 DIAGNOSIS — M2550 Pain in unspecified joint: Secondary | ICD-10-CM

## 2022-04-16 DIAGNOSIS — E65 Localized adiposity: Secondary | ICD-10-CM | POA: Diagnosis not present

## 2022-04-16 DIAGNOSIS — N189 Chronic kidney disease, unspecified: Secondary | ICD-10-CM | POA: Diagnosis not present

## 2022-04-16 DIAGNOSIS — J45909 Unspecified asthma, uncomplicated: Secondary | ICD-10-CM | POA: Diagnosis not present

## 2022-04-16 DIAGNOSIS — G4733 Obstructive sleep apnea (adult) (pediatric): Secondary | ICD-10-CM | POA: Diagnosis not present

## 2022-04-16 DIAGNOSIS — R7301 Impaired fasting glucose: Secondary | ICD-10-CM | POA: Diagnosis not present

## 2022-04-16 DIAGNOSIS — E7800 Pure hypercholesterolemia, unspecified: Secondary | ICD-10-CM | POA: Diagnosis not present

## 2022-04-16 DIAGNOSIS — M5030 Other cervical disc degeneration, unspecified cervical region: Secondary | ICD-10-CM | POA: Diagnosis not present

## 2022-04-16 DIAGNOSIS — I10 Essential (primary) hypertension: Secondary | ICD-10-CM | POA: Diagnosis not present

## 2022-04-16 DIAGNOSIS — Z8639 Personal history of other endocrine, nutritional and metabolic disease: Secondary | ICD-10-CM | POA: Diagnosis not present

## 2022-05-08 ENCOUNTER — Ambulatory Visit: Payer: Medicare Other | Admitting: Rheumatology

## 2022-05-08 DIAGNOSIS — R7301 Impaired fasting glucose: Secondary | ICD-10-CM | POA: Diagnosis not present

## 2022-05-08 DIAGNOSIS — E65 Localized adiposity: Secondary | ICD-10-CM | POA: Diagnosis not present

## 2022-05-08 DIAGNOSIS — N189 Chronic kidney disease, unspecified: Secondary | ICD-10-CM | POA: Diagnosis not present

## 2022-05-08 DIAGNOSIS — E538 Deficiency of other specified B group vitamins: Secondary | ICD-10-CM | POA: Diagnosis not present

## 2022-05-08 DIAGNOSIS — I10 Essential (primary) hypertension: Secondary | ICD-10-CM | POA: Diagnosis not present

## 2022-05-08 DIAGNOSIS — E559 Vitamin D deficiency, unspecified: Secondary | ICD-10-CM | POA: Diagnosis not present

## 2022-05-08 DIAGNOSIS — G4733 Obstructive sleep apnea (adult) (pediatric): Secondary | ICD-10-CM | POA: Diagnosis not present

## 2022-05-08 DIAGNOSIS — D473 Essential (hemorrhagic) thrombocythemia: Secondary | ICD-10-CM | POA: Diagnosis not present

## 2022-05-08 DIAGNOSIS — G43909 Migraine, unspecified, not intractable, without status migrainosus: Secondary | ICD-10-CM | POA: Diagnosis not present

## 2022-05-16 DIAGNOSIS — M25562 Pain in left knee: Secondary | ICD-10-CM | POA: Diagnosis not present

## 2022-05-16 DIAGNOSIS — M170 Bilateral primary osteoarthritis of knee: Secondary | ICD-10-CM | POA: Diagnosis not present

## 2022-05-16 DIAGNOSIS — M25561 Pain in right knee: Secondary | ICD-10-CM | POA: Diagnosis not present

## 2022-05-21 DIAGNOSIS — H524 Presbyopia: Secondary | ICD-10-CM | POA: Diagnosis not present

## 2022-05-21 DIAGNOSIS — H2513 Age-related nuclear cataract, bilateral: Secondary | ICD-10-CM | POA: Diagnosis not present

## 2022-05-21 DIAGNOSIS — H40013 Open angle with borderline findings, low risk, bilateral: Secondary | ICD-10-CM | POA: Diagnosis not present

## 2022-05-21 DIAGNOSIS — H5213 Myopia, bilateral: Secondary | ICD-10-CM | POA: Diagnosis not present

## 2022-05-29 DIAGNOSIS — M5030 Other cervical disc degeneration, unspecified cervical region: Secondary | ICD-10-CM | POA: Diagnosis not present

## 2022-05-29 DIAGNOSIS — I10 Essential (primary) hypertension: Secondary | ICD-10-CM | POA: Diagnosis not present

## 2022-05-29 DIAGNOSIS — E65 Localized adiposity: Secondary | ICD-10-CM | POA: Diagnosis not present

## 2022-05-29 DIAGNOSIS — G4733 Obstructive sleep apnea (adult) (pediatric): Secondary | ICD-10-CM | POA: Diagnosis not present

## 2022-05-29 DIAGNOSIS — N189 Chronic kidney disease, unspecified: Secondary | ICD-10-CM | POA: Diagnosis not present

## 2022-06-19 DIAGNOSIS — M4802 Spinal stenosis, cervical region: Secondary | ICD-10-CM | POA: Diagnosis not present

## 2022-06-19 DIAGNOSIS — M170 Bilateral primary osteoarthritis of knee: Secondary | ICD-10-CM | POA: Diagnosis not present

## 2022-06-19 DIAGNOSIS — G43009 Migraine without aura, not intractable, without status migrainosus: Secondary | ICD-10-CM | POA: Diagnosis not present

## 2022-06-19 DIAGNOSIS — M4722 Other spondylosis with radiculopathy, cervical region: Secondary | ICD-10-CM | POA: Diagnosis not present

## 2022-06-19 DIAGNOSIS — J4540 Moderate persistent asthma, uncomplicated: Secondary | ICD-10-CM | POA: Diagnosis not present

## 2022-07-24 DIAGNOSIS — M5030 Other cervical disc degeneration, unspecified cervical region: Secondary | ICD-10-CM | POA: Diagnosis not present

## 2022-07-24 DIAGNOSIS — R7301 Impaired fasting glucose: Secondary | ICD-10-CM | POA: Diagnosis not present

## 2022-07-24 DIAGNOSIS — E65 Localized adiposity: Secondary | ICD-10-CM | POA: Diagnosis not present

## 2022-08-05 DIAGNOSIS — R0683 Snoring: Secondary | ICD-10-CM | POA: Diagnosis not present

## 2022-08-14 DIAGNOSIS — R7301 Impaired fasting glucose: Secondary | ICD-10-CM | POA: Diagnosis not present

## 2022-09-11 DIAGNOSIS — G43009 Migraine without aura, not intractable, without status migrainosus: Secondary | ICD-10-CM | POA: Diagnosis not present

## 2022-09-11 DIAGNOSIS — I10 Essential (primary) hypertension: Secondary | ICD-10-CM | POA: Diagnosis not present

## 2022-09-11 DIAGNOSIS — R7301 Impaired fasting glucose: Secondary | ICD-10-CM | POA: Diagnosis not present

## 2022-10-01 NOTE — Progress Notes (Deleted)
Office Visit Note  Patient: Barbara Richardson             Date of Birth: 05-Jun-1971           MRN: [id number]             PCP: Lujean Amel, MD Referring: Lujean Amel, MD Visit Date: 10/15/2022 Occupation: @GUAROCC @  Subjective:  No chief complaint on file.   History of Present Illness: Barbara Richardson is a 52 y.o. female ***     Activities of Daily Living:  Patient reports morning stiffness for *** {minute/hour:19697}.   Patient {ACTIONS;DENIES/REPORTS:21021675::"Denies"} nocturnal pain.  Difficulty dressing/grooming: {ACTIONS;DENIES/REPORTS:21021675::"Denies"} Difficulty climbing stairs: {ACTIONS;DENIES/REPORTS:21021675::"Denies"} Difficulty getting out of chair: {ACTIONS;DENIES/REPORTS:21021675::"Denies"} Difficulty using hands for taps, buttons, cutlery, and/or writing: {ACTIONS;DENIES/REPORTS:21021675::"Denies"}  No Rheumatology ROS completed.   PMFS History:  Patient Active Problem List   Diagnosis Date Noted   Cervical spondylosis with radiculopathy 12/25/2021   Cervical radiculopathy 01/23/2018   Morbid obesity due to excess calories 05/31/2017   OSA (obstructive sleep apnea) 08/31/2014   Inadequate sleep hygiene 08/31/2014   Excessive daytime sleepiness 05/23/2014   Dyspnea on exertion 05/06/2014   Essential hypertension 10/18/2011   Cough variant asthma 10/18/2011   GERD (gastroesophageal reflux disease) 10/18/2011    Past Medical History:  Diagnosis Date   Abnormal Pap smear    Anemia    Anxiety    Asthma    Atrial fibrillation ([place])    Bipolar disorder (HCC)    BV (bacterial vaginosis) 08/24/2010   Cervical radiculopathy 01/23/2018   Cough variant asthma 10/18/2011   05/29/2017  After extensive coaching HFA effectiveness =    90% > continue symb 160 2bid - Allergy profile 05/29/17  >  Eos 0.6 /  IgE  172  RAST pos mild mold only  - FENO 05/29/2017  =   36 -   08/11/2017  After extensive coaching inhaler device  effectiveness =    75% >  continue symicort 160  2bid/ singulair  - PFT's  08/11/2017  FEV1 2.04 (77 % ) ratio 73  p 7 % improvement from saba p symb/saba   DDD (degenerative disc disease), cervical    Depression    DJD (degenerative joint disease)    DUB (dysfunctional uterine bleeding) 02/08/2010   Dyspnea    Dyspnea on exertion 05/06/2014   Followed in Pulmonary clinic/ [facility]/ [place] - trial off all acei 05/06/2014  > improved 07/20/14 with nl pfts x low erv > try off symbicort  - 05/29/2017  Walked RA x 3 laps @ 185 ft each stopped due to  End of study, fast pace, leg pain and sob and end with sats down to 90%      Endometrial polyp 03/2010   GERD (gastroesophageal reflux disease)    Hx of chlamydia infection 1995   Hx of constipation 10/17/2010   Hx of menorrhagia 03/2010   Hypertension    Inadequate sleep hygiene 08/31/2014   Migraines    Morbid obesity due to excess calories ([place]) 05/31/2017   Obesity    Obesity, morbid (more than 100 lbs over ideal weight or BMI > 40) ([place]) 2011   OSA (obstructive sleep apnea) 08/31/2014   Uterine fibroid 03/2010   Yeast infection     Family History  Problem Relation Age of Onset   Diabetes Father    Cancer Maternal Grandfather    Heart disease Maternal Grandmother    Past Surgical History:  Procedure Laterality Date   ANTERIOR CERVICAL DECOMP/DISCECTOMY  FUSION N/A 01/23/2018   Procedure: ANTERIOR CERVICAL DECOMPRESSION/DISCECTOMY FUSION CERVICAL THREE- CERVICAL FOUR, CERVICAL FOUR- CERVICAL FIVE;  Surgeon: Consuella Lose, MD;  Location: [facility];  Service: Neurosurgery;  Laterality: N/A;   ANTERIOR CERVICAL DECOMP/DISCECTOMY FUSION N/A 12/25/2021   Procedure: Anterior Cervical Decompression Fusion Cervical five-six, Cervical six-seven;  Surgeon: Consuella Lose, MD;  Location: [facility];  Service: Neurosurgery;  Laterality: N/A;   BACK SURGERY     BREAST REDUCTION SURGERY  1999   CARPAL TUNNEL RELEASE     CESAREAN SECTION     HYSTEROSCOPY W/ ENDOMETRIAL  ABLATION  2011   KNEE SURGERY Left    fracture   TONSILLECTOMY  2010   TUBAL LIGATION  1999   Social History   Social History Narrative   Not on file   Immunization History  Administered Date(s) Administered   Influenza Split 04/25/2009   Influenza Whole 05/01/2017   Influenza,inj,quad, With Preservative 03/06/2016   PPD Test 03/06/2016     Objective: Vital Signs: There were no vitals taken for this visit.   Physical Exam   Musculoskeletal Exam: ***  CDAI Exam: CDAI Score: -- Patient Global: --; Provider Global: -- Swollen: --; Tender: -- Joint Exam 10/15/2022   No joint exam has been documented for this visit   There is currently no information documented on the homunculus. Go to the Rheumatology activity and complete the homunculus joint exam.  Investigation: No additional findings.  Imaging: No results found.  Recent Labs: Lab Results  Component Value Date   WBC 7.4 12/19/2021   HGB 14.5 12/19/2021   PLT 301 12/19/2021   NA 136 12/19/2021   K 4.3 12/19/2021   CL 109 12/19/2021   CO2 21 (L) 12/19/2021   GLUCOSE 82 12/19/2021   BUN 15 12/19/2021   CREATININE 1.04 (H) 12/19/2021   BILITOT 0.6 04/23/2010   ALKPHOS 46 04/23/2010   AST 15 04/23/2010   ALT 11 04/23/2010   PROT 8.2 04/23/2010   ALBUMIN 3.9 04/23/2010   CALCIUM 9.3 12/19/2021   GFRAA >60 08/18/2018   September 19, 2021 ESR 53, anti-CCP negative, TSH normal, B12 normal, LDL 150  Speciality Comments: No specialty comments available.  Procedures:  No procedures performed Allergies: Other   Assessment / Plan:     Visit Diagnoses: Polyarthralgia  Elevated sed rate  Orders: No orders of the defined types were placed in this encounter.  No orders of the defined types were placed in this encounter.   Face-to-face time spent with patient was *** minutes. Greater than 50% of time was spent in counseling and coordination of care.  Follow-Up Instructions: No follow-ups on  file.   Bo Merino, MD  Note - This record has been created using Editor, commissioning.  Chart creation errors have been sought, but may not always  have been located. Such creation errors do not reflect on  the standard of medical care.

## 2022-10-08 DIAGNOSIS — I10 Essential (primary) hypertension: Secondary | ICD-10-CM | POA: Diagnosis not present

## 2022-10-08 DIAGNOSIS — R7301 Impaired fasting glucose: Secondary | ICD-10-CM | POA: Diagnosis not present

## 2022-10-08 DIAGNOSIS — G43009 Migraine without aura, not intractable, without status migrainosus: Secondary | ICD-10-CM | POA: Diagnosis not present

## 2022-10-15 ENCOUNTER — Encounter: Payer: Medicare Other | Admitting: Rheumatology

## 2022-10-15 DIAGNOSIS — Z8719 Personal history of other diseases of the digestive system: Secondary | ICD-10-CM

## 2022-10-15 DIAGNOSIS — I10 Essential (primary) hypertension: Secondary | ICD-10-CM

## 2022-10-15 DIAGNOSIS — Z8673 Personal history of transient ischemic attack (TIA), and cerebral infarction without residual deficits: Secondary | ICD-10-CM

## 2022-10-15 DIAGNOSIS — M4722 Other spondylosis with radiculopathy, cervical region: Secondary | ICD-10-CM

## 2022-10-15 DIAGNOSIS — Z8669 Personal history of other diseases of the nervous system and sense organs: Secondary | ICD-10-CM

## 2022-10-15 DIAGNOSIS — R700 Elevated erythrocyte sedimentation rate: Secondary | ICD-10-CM

## 2022-10-15 DIAGNOSIS — Z8679 Personal history of other diseases of the circulatory system: Secondary | ICD-10-CM

## 2022-10-15 DIAGNOSIS — R7989 Other specified abnormal findings of blood chemistry: Secondary | ICD-10-CM

## 2022-10-15 DIAGNOSIS — F419 Anxiety disorder, unspecified: Secondary | ICD-10-CM

## 2022-10-15 DIAGNOSIS — G4733 Obstructive sleep apnea (adult) (pediatric): Secondary | ICD-10-CM

## 2022-10-15 DIAGNOSIS — J45991 Cough variant asthma: Secondary | ICD-10-CM

## 2022-10-15 DIAGNOSIS — M2550 Pain in unspecified joint: Secondary | ICD-10-CM

## 2022-10-18 ENCOUNTER — Other Ambulatory Visit (HOSPITAL_COMMUNITY): Payer: Self-pay

## 2022-10-21 DIAGNOSIS — N1831 Chronic kidney disease, stage 3a: Secondary | ICD-10-CM | POA: Diagnosis not present

## 2022-10-21 DIAGNOSIS — R7301 Impaired fasting glucose: Secondary | ICD-10-CM | POA: Diagnosis not present

## 2022-10-21 DIAGNOSIS — I10 Essential (primary) hypertension: Secondary | ICD-10-CM | POA: Diagnosis not present

## 2022-10-21 DIAGNOSIS — Z8249 Family history of ischemic heart disease and other diseases of the circulatory system: Secondary | ICD-10-CM | POA: Diagnosis not present

## 2022-10-21 DIAGNOSIS — Z0001 Encounter for general adult medical examination with abnormal findings: Secondary | ICD-10-CM | POA: Diagnosis not present

## 2022-10-21 DIAGNOSIS — J4550 Severe persistent asthma, uncomplicated: Secondary | ICD-10-CM | POA: Diagnosis not present

## 2022-10-21 DIAGNOSIS — Z79899 Other long term (current) drug therapy: Secondary | ICD-10-CM | POA: Diagnosis not present

## 2022-10-21 DIAGNOSIS — I129 Hypertensive chronic kidney disease with stage 1 through stage 4 chronic kidney disease, or unspecified chronic kidney disease: Secondary | ICD-10-CM | POA: Diagnosis not present

## 2022-10-21 DIAGNOSIS — E7800 Pure hypercholesterolemia, unspecified: Secondary | ICD-10-CM | POA: Diagnosis not present

## 2022-11-05 DIAGNOSIS — N1831 Chronic kidney disease, stage 3a: Secondary | ICD-10-CM | POA: Diagnosis not present

## 2022-11-05 DIAGNOSIS — G43009 Migraine without aura, not intractable, without status migrainosus: Secondary | ICD-10-CM | POA: Diagnosis not present

## 2022-11-05 DIAGNOSIS — G8929 Other chronic pain: Secondary | ICD-10-CM | POA: Diagnosis not present

## 2022-11-05 DIAGNOSIS — I10 Essential (primary) hypertension: Secondary | ICD-10-CM | POA: Diagnosis not present

## 2022-11-05 DIAGNOSIS — R7301 Impaired fasting glucose: Secondary | ICD-10-CM | POA: Diagnosis not present

## 2022-11-05 DIAGNOSIS — M25561 Pain in right knee: Secondary | ICD-10-CM | POA: Diagnosis not present

## 2022-11-27 DIAGNOSIS — M25561 Pain in right knee: Secondary | ICD-10-CM | POA: Diagnosis not present

## 2022-11-27 DIAGNOSIS — R7301 Impaired fasting glucose: Secondary | ICD-10-CM | POA: Diagnosis not present

## 2022-11-27 DIAGNOSIS — G8929 Other chronic pain: Secondary | ICD-10-CM | POA: Diagnosis not present

## 2022-11-27 DIAGNOSIS — N1831 Chronic kidney disease, stage 3a: Secondary | ICD-10-CM | POA: Diagnosis not present

## 2022-11-27 DIAGNOSIS — G43009 Migraine without aura, not intractable, without status migrainosus: Secondary | ICD-10-CM | POA: Diagnosis not present

## 2022-11-27 DIAGNOSIS — I10 Essential (primary) hypertension: Secondary | ICD-10-CM | POA: Diagnosis not present

## 2022-12-25 DIAGNOSIS — N1831 Chronic kidney disease, stage 3a: Secondary | ICD-10-CM | POA: Diagnosis not present

## 2022-12-25 DIAGNOSIS — R7301 Impaired fasting glucose: Secondary | ICD-10-CM | POA: Diagnosis not present

## 2022-12-25 DIAGNOSIS — G8929 Other chronic pain: Secondary | ICD-10-CM | POA: Diagnosis not present

## 2022-12-25 DIAGNOSIS — G43009 Migraine without aura, not intractable, without status migrainosus: Secondary | ICD-10-CM | POA: Diagnosis not present

## 2022-12-25 DIAGNOSIS — I10 Essential (primary) hypertension: Secondary | ICD-10-CM | POA: Diagnosis not present

## 2022-12-25 DIAGNOSIS — M25561 Pain in right knee: Secondary | ICD-10-CM | POA: Diagnosis not present

## 2023-01-22 DIAGNOSIS — G43009 Migraine without aura, not intractable, without status migrainosus: Secondary | ICD-10-CM | POA: Diagnosis not present

## 2023-01-22 DIAGNOSIS — G8929 Other chronic pain: Secondary | ICD-10-CM | POA: Diagnosis not present

## 2023-01-22 DIAGNOSIS — M25561 Pain in right knee: Secondary | ICD-10-CM | POA: Diagnosis not present

## 2023-01-22 DIAGNOSIS — N1831 Chronic kidney disease, stage 3a: Secondary | ICD-10-CM | POA: Diagnosis not present

## 2023-01-22 DIAGNOSIS — R7301 Impaired fasting glucose: Secondary | ICD-10-CM | POA: Diagnosis not present

## 2023-01-22 DIAGNOSIS — K5909 Other constipation: Secondary | ICD-10-CM | POA: Diagnosis not present

## 2023-01-22 DIAGNOSIS — I10 Essential (primary) hypertension: Secondary | ICD-10-CM | POA: Diagnosis not present

## 2023-03-10 DIAGNOSIS — G43009 Migraine without aura, not intractable, without status migrainosus: Secondary | ICD-10-CM | POA: Diagnosis not present

## 2023-03-10 DIAGNOSIS — K5909 Other constipation: Secondary | ICD-10-CM | POA: Diagnosis not present

## 2023-03-10 DIAGNOSIS — N1831 Chronic kidney disease, stage 3a: Secondary | ICD-10-CM | POA: Diagnosis not present

## 2023-03-10 DIAGNOSIS — G8929 Other chronic pain: Secondary | ICD-10-CM | POA: Diagnosis not present

## 2023-03-10 DIAGNOSIS — I10 Essential (primary) hypertension: Secondary | ICD-10-CM | POA: Diagnosis not present

## 2023-03-10 DIAGNOSIS — R7301 Impaired fasting glucose: Secondary | ICD-10-CM | POA: Diagnosis not present

## 2023-03-26 DIAGNOSIS — R262 Difficulty in walking, not elsewhere classified: Secondary | ICD-10-CM | POA: Diagnosis not present

## 2023-03-26 DIAGNOSIS — M170 Bilateral primary osteoarthritis of knee: Secondary | ICD-10-CM | POA: Diagnosis not present

## 2023-04-01 DIAGNOSIS — R262 Difficulty in walking, not elsewhere classified: Secondary | ICD-10-CM | POA: Diagnosis not present

## 2023-04-01 DIAGNOSIS — M170 Bilateral primary osteoarthritis of knee: Secondary | ICD-10-CM | POA: Diagnosis not present

## 2023-04-08 DIAGNOSIS — N1831 Chronic kidney disease, stage 3a: Secondary | ICD-10-CM | POA: Diagnosis not present

## 2023-04-08 DIAGNOSIS — R7301 Impaired fasting glucose: Secondary | ICD-10-CM | POA: Diagnosis not present

## 2023-04-08 DIAGNOSIS — K5909 Other constipation: Secondary | ICD-10-CM | POA: Diagnosis not present

## 2023-04-08 DIAGNOSIS — G8929 Other chronic pain: Secondary | ICD-10-CM | POA: Diagnosis not present

## 2023-04-08 DIAGNOSIS — M25561 Pain in right knee: Secondary | ICD-10-CM | POA: Diagnosis not present

## 2023-04-08 DIAGNOSIS — I10 Essential (primary) hypertension: Secondary | ICD-10-CM | POA: Diagnosis not present

## 2023-04-08 DIAGNOSIS — G43009 Migraine without aura, not intractable, without status migrainosus: Secondary | ICD-10-CM | POA: Diagnosis not present

## 2023-04-09 DIAGNOSIS — M170 Bilateral primary osteoarthritis of knee: Secondary | ICD-10-CM | POA: Diagnosis not present

## 2023-04-09 DIAGNOSIS — R262 Difficulty in walking, not elsewhere classified: Secondary | ICD-10-CM | POA: Diagnosis not present

## 2023-04-18 DIAGNOSIS — R262 Difficulty in walking, not elsewhere classified: Secondary | ICD-10-CM | POA: Diagnosis not present

## 2023-04-18 DIAGNOSIS — M170 Bilateral primary osteoarthritis of knee: Secondary | ICD-10-CM | POA: Diagnosis not present

## 2023-04-22 DIAGNOSIS — R7301 Impaired fasting glucose: Secondary | ICD-10-CM | POA: Diagnosis not present

## 2023-04-22 DIAGNOSIS — I10 Essential (primary) hypertension: Secondary | ICD-10-CM | POA: Diagnosis not present

## 2023-04-22 DIAGNOSIS — Z23 Encounter for immunization: Secondary | ICD-10-CM | POA: Diagnosis not present

## 2023-04-22 DIAGNOSIS — H9193 Unspecified hearing loss, bilateral: Secondary | ICD-10-CM | POA: Diagnosis not present

## 2023-04-22 DIAGNOSIS — Z79899 Other long term (current) drug therapy: Secondary | ICD-10-CM | POA: Diagnosis not present

## 2023-04-29 DIAGNOSIS — M170 Bilateral primary osteoarthritis of knee: Secondary | ICD-10-CM | POA: Diagnosis not present

## 2023-04-29 DIAGNOSIS — R262 Difficulty in walking, not elsewhere classified: Secondary | ICD-10-CM | POA: Diagnosis not present

## 2023-05-05 DIAGNOSIS — H905 Unspecified sensorineural hearing loss: Secondary | ICD-10-CM | POA: Diagnosis not present

## 2023-05-06 DIAGNOSIS — R262 Difficulty in walking, not elsewhere classified: Secondary | ICD-10-CM | POA: Diagnosis not present

## 2023-05-06 DIAGNOSIS — M170 Bilateral primary osteoarthritis of knee: Secondary | ICD-10-CM | POA: Diagnosis not present

## 2023-05-08 DIAGNOSIS — R262 Difficulty in walking, not elsewhere classified: Secondary | ICD-10-CM | POA: Diagnosis not present

## 2023-05-08 DIAGNOSIS — M170 Bilateral primary osteoarthritis of knee: Secondary | ICD-10-CM | POA: Diagnosis not present

## 2023-05-13 DIAGNOSIS — M170 Bilateral primary osteoarthritis of knee: Secondary | ICD-10-CM | POA: Diagnosis not present

## 2023-05-13 DIAGNOSIS — R262 Difficulty in walking, not elsewhere classified: Secondary | ICD-10-CM | POA: Diagnosis not present

## 2023-05-19 DIAGNOSIS — Z1231 Encounter for screening mammogram for malignant neoplasm of breast: Secondary | ICD-10-CM | POA: Diagnosis not present

## 2023-05-20 DIAGNOSIS — R262 Difficulty in walking, not elsewhere classified: Secondary | ICD-10-CM | POA: Diagnosis not present

## 2023-05-20 DIAGNOSIS — M170 Bilateral primary osteoarthritis of knee: Secondary | ICD-10-CM | POA: Diagnosis not present

## 2023-06-17 DIAGNOSIS — G8929 Other chronic pain: Secondary | ICD-10-CM | POA: Diagnosis not present

## 2023-06-17 DIAGNOSIS — N1831 Chronic kidney disease, stage 3a: Secondary | ICD-10-CM | POA: Diagnosis not present

## 2023-06-17 DIAGNOSIS — R7301 Impaired fasting glucose: Secondary | ICD-10-CM | POA: Diagnosis not present

## 2023-06-17 DIAGNOSIS — I10 Essential (primary) hypertension: Secondary | ICD-10-CM | POA: Diagnosis not present

## 2023-06-17 DIAGNOSIS — K5909 Other constipation: Secondary | ICD-10-CM | POA: Diagnosis not present

## 2023-06-17 DIAGNOSIS — G43009 Migraine without aura, not intractable, without status migrainosus: Secondary | ICD-10-CM | POA: Diagnosis not present

## 2023-06-17 DIAGNOSIS — M25561 Pain in right knee: Secondary | ICD-10-CM | POA: Diagnosis not present

## 2023-09-17 DIAGNOSIS — J4521 Mild intermittent asthma with (acute) exacerbation: Secondary | ICD-10-CM | POA: Diagnosis not present

## 2023-09-17 DIAGNOSIS — R030 Elevated blood-pressure reading, without diagnosis of hypertension: Secondary | ICD-10-CM | POA: Diagnosis not present

## 2023-09-17 DIAGNOSIS — Z760 Encounter for issue of repeat prescription: Secondary | ICD-10-CM | POA: Diagnosis not present

## 2023-10-27 DIAGNOSIS — R7301 Impaired fasting glucose: Secondary | ICD-10-CM | POA: Diagnosis not present

## 2023-10-27 DIAGNOSIS — M25561 Pain in right knee: Secondary | ICD-10-CM | POA: Diagnosis not present

## 2023-10-27 DIAGNOSIS — N1831 Chronic kidney disease, stage 3a: Secondary | ICD-10-CM | POA: Diagnosis not present

## 2023-10-27 DIAGNOSIS — G8929 Other chronic pain: Secondary | ICD-10-CM | POA: Diagnosis not present

## 2023-10-27 DIAGNOSIS — K5909 Other constipation: Secondary | ICD-10-CM | POA: Diagnosis not present

## 2023-10-27 DIAGNOSIS — I10 Essential (primary) hypertension: Secondary | ICD-10-CM | POA: Diagnosis not present

## 2023-10-27 DIAGNOSIS — G43009 Migraine without aura, not intractable, without status migrainosus: Secondary | ICD-10-CM | POA: Diagnosis not present

## 2023-10-28 DIAGNOSIS — N1831 Chronic kidney disease, stage 3a: Secondary | ICD-10-CM | POA: Diagnosis not present

## 2023-10-28 DIAGNOSIS — G43009 Migraine without aura, not intractable, without status migrainosus: Secondary | ICD-10-CM | POA: Diagnosis not present

## 2023-10-28 DIAGNOSIS — Z79899 Other long term (current) drug therapy: Secondary | ICD-10-CM | POA: Diagnosis not present

## 2023-10-28 DIAGNOSIS — Z0000 Encounter for general adult medical examination without abnormal findings: Secondary | ICD-10-CM | POA: Diagnosis not present

## 2023-10-28 DIAGNOSIS — R7301 Impaired fasting glucose: Secondary | ICD-10-CM | POA: Diagnosis not present

## 2023-10-28 DIAGNOSIS — Z23 Encounter for immunization: Secondary | ICD-10-CM | POA: Diagnosis not present

## 2023-10-28 DIAGNOSIS — J4550 Severe persistent asthma, uncomplicated: Secondary | ICD-10-CM | POA: Diagnosis not present

## 2023-10-28 DIAGNOSIS — I10 Essential (primary) hypertension: Secondary | ICD-10-CM | POA: Diagnosis not present

## 2023-11-05 DIAGNOSIS — Z7189 Other specified counseling: Secondary | ICD-10-CM | POA: Diagnosis not present

## 2023-12-01 DIAGNOSIS — G43009 Migraine without aura, not intractable, without status migrainosus: Secondary | ICD-10-CM | POA: Diagnosis not present

## 2023-12-01 DIAGNOSIS — K5909 Other constipation: Secondary | ICD-10-CM | POA: Diagnosis not present

## 2023-12-01 DIAGNOSIS — I10 Essential (primary) hypertension: Secondary | ICD-10-CM | POA: Diagnosis not present

## 2023-12-01 DIAGNOSIS — M25561 Pain in right knee: Secondary | ICD-10-CM | POA: Diagnosis not present

## 2023-12-01 DIAGNOSIS — N1831 Chronic kidney disease, stage 3a: Secondary | ICD-10-CM | POA: Diagnosis not present

## 2023-12-01 DIAGNOSIS — R7301 Impaired fasting glucose: Secondary | ICD-10-CM | POA: Diagnosis not present

## 2023-12-01 DIAGNOSIS — G8929 Other chronic pain: Secondary | ICD-10-CM | POA: Diagnosis not present

## 2023-12-30 ENCOUNTER — Other Ambulatory Visit (HOSPITAL_BASED_OUTPATIENT_CLINIC_OR_DEPARTMENT_OTHER): Payer: Self-pay

## 2023-12-30 ENCOUNTER — Other Ambulatory Visit: Payer: Self-pay

## 2023-12-30 DIAGNOSIS — R7301 Impaired fasting glucose: Secondary | ICD-10-CM | POA: Diagnosis not present

## 2023-12-30 DIAGNOSIS — G8929 Other chronic pain: Secondary | ICD-10-CM | POA: Diagnosis not present

## 2023-12-30 DIAGNOSIS — G43009 Migraine without aura, not intractable, without status migrainosus: Secondary | ICD-10-CM | POA: Diagnosis not present

## 2023-12-30 DIAGNOSIS — M25561 Pain in right knee: Secondary | ICD-10-CM | POA: Diagnosis not present

## 2023-12-30 DIAGNOSIS — N1831 Chronic kidney disease, stage 3a: Secondary | ICD-10-CM | POA: Diagnosis not present

## 2023-12-30 DIAGNOSIS — I10 Essential (primary) hypertension: Secondary | ICD-10-CM | POA: Diagnosis not present

## 2023-12-30 DIAGNOSIS — K5909 Other constipation: Secondary | ICD-10-CM | POA: Diagnosis not present

## 2023-12-30 MED ORDER — WEGOVY 2.4 MG/0.75ML ~~LOC~~ SOAJ
2.4000 mg | SUBCUTANEOUS | 0 refills | Status: DC
  Filled 2023-12-30 (×2): qty 3, 28d supply, fill #0

## 2024-01-21 ENCOUNTER — Other Ambulatory Visit (HOSPITAL_BASED_OUTPATIENT_CLINIC_OR_DEPARTMENT_OTHER): Payer: Self-pay

## 2024-01-21 MED ORDER — WEGOVY 2.4 MG/0.75ML ~~LOC~~ SOAJ
2.4000 mg | SUBCUTANEOUS | 0 refills | Status: AC
  Filled 2024-01-21: qty 3, 28d supply, fill #0

## 2024-01-27 ENCOUNTER — Other Ambulatory Visit (HOSPITAL_BASED_OUTPATIENT_CLINIC_OR_DEPARTMENT_OTHER): Payer: Self-pay

## 2024-01-27 DIAGNOSIS — G43009 Migraine without aura, not intractable, without status migrainosus: Secondary | ICD-10-CM | POA: Diagnosis not present

## 2024-01-27 DIAGNOSIS — Z1211 Encounter for screening for malignant neoplasm of colon: Secondary | ICD-10-CM | POA: Diagnosis not present

## 2024-01-27 DIAGNOSIS — I10 Essential (primary) hypertension: Secondary | ICD-10-CM | POA: Diagnosis not present

## 2024-01-27 DIAGNOSIS — R7301 Impaired fasting glucose: Secondary | ICD-10-CM | POA: Diagnosis not present

## 2024-01-27 DIAGNOSIS — N1831 Chronic kidney disease, stage 3a: Secondary | ICD-10-CM | POA: Diagnosis not present

## 2024-01-27 DIAGNOSIS — J4550 Severe persistent asthma, uncomplicated: Secondary | ICD-10-CM | POA: Diagnosis not present

## 2024-02-04 DIAGNOSIS — K5909 Other constipation: Secondary | ICD-10-CM | POA: Diagnosis not present

## 2024-02-04 DIAGNOSIS — R7301 Impaired fasting glucose: Secondary | ICD-10-CM | POA: Diagnosis not present

## 2024-02-04 DIAGNOSIS — G8929 Other chronic pain: Secondary | ICD-10-CM | POA: Diagnosis not present

## 2024-02-04 DIAGNOSIS — G43009 Migraine without aura, not intractable, without status migrainosus: Secondary | ICD-10-CM | POA: Diagnosis not present

## 2024-02-04 DIAGNOSIS — I10 Essential (primary) hypertension: Secondary | ICD-10-CM | POA: Diagnosis not present

## 2024-02-04 DIAGNOSIS — N1831 Chronic kidney disease, stage 3a: Secondary | ICD-10-CM | POA: Diagnosis not present

## 2024-03-03 DIAGNOSIS — R7301 Impaired fasting glucose: Secondary | ICD-10-CM | POA: Diagnosis not present

## 2024-03-03 DIAGNOSIS — G43009 Migraine without aura, not intractable, without status migrainosus: Secondary | ICD-10-CM | POA: Diagnosis not present

## 2024-03-03 DIAGNOSIS — K5909 Other constipation: Secondary | ICD-10-CM | POA: Diagnosis not present

## 2024-03-03 DIAGNOSIS — G8929 Other chronic pain: Secondary | ICD-10-CM | POA: Diagnosis not present

## 2024-03-03 DIAGNOSIS — N1831 Chronic kidney disease, stage 3a: Secondary | ICD-10-CM | POA: Diagnosis not present

## 2024-03-03 DIAGNOSIS — I10 Essential (primary) hypertension: Secondary | ICD-10-CM | POA: Diagnosis not present

## 2024-03-03 DIAGNOSIS — M25561 Pain in right knee: Secondary | ICD-10-CM | POA: Diagnosis not present

## 2024-03-04 DIAGNOSIS — Z1211 Encounter for screening for malignant neoplasm of colon: Secondary | ICD-10-CM | POA: Diagnosis not present

## 2024-03-04 DIAGNOSIS — K5904 Chronic idiopathic constipation: Secondary | ICD-10-CM | POA: Diagnosis not present

## 2024-03-04 DIAGNOSIS — R1310 Dysphagia, unspecified: Secondary | ICD-10-CM | POA: Diagnosis not present

## 2024-03-04 DIAGNOSIS — R195 Other fecal abnormalities: Secondary | ICD-10-CM | POA: Diagnosis not present

## 2024-03-04 DIAGNOSIS — I10 Essential (primary) hypertension: Secondary | ICD-10-CM | POA: Diagnosis not present

## 2024-03-04 DIAGNOSIS — K219 Gastro-esophageal reflux disease without esophagitis: Secondary | ICD-10-CM | POA: Diagnosis not present

## 2024-03-17 DIAGNOSIS — Z1211 Encounter for screening for malignant neoplasm of colon: Secondary | ICD-10-CM | POA: Diagnosis not present

## 2024-03-31 DIAGNOSIS — M25561 Pain in right knee: Secondary | ICD-10-CM | POA: Diagnosis not present

## 2024-03-31 DIAGNOSIS — R7301 Impaired fasting glucose: Secondary | ICD-10-CM | POA: Diagnosis not present

## 2024-03-31 DIAGNOSIS — G43009 Migraine without aura, not intractable, without status migrainosus: Secondary | ICD-10-CM | POA: Diagnosis not present

## 2024-03-31 DIAGNOSIS — G8929 Other chronic pain: Secondary | ICD-10-CM | POA: Diagnosis not present

## 2024-03-31 DIAGNOSIS — K5909 Other constipation: Secondary | ICD-10-CM | POA: Diagnosis not present

## 2024-03-31 DIAGNOSIS — I10 Essential (primary) hypertension: Secondary | ICD-10-CM | POA: Diagnosis not present

## 2024-03-31 DIAGNOSIS — N1831 Chronic kidney disease, stage 3a: Secondary | ICD-10-CM | POA: Diagnosis not present

## 2024-04-28 DIAGNOSIS — Z79899 Other long term (current) drug therapy: Secondary | ICD-10-CM | POA: Diagnosis not present

## 2024-04-28 DIAGNOSIS — R7301 Impaired fasting glucose: Secondary | ICD-10-CM | POA: Diagnosis not present
